# Patient Record
Sex: Female | Born: 1945 | Race: White | Hispanic: No | Marital: Single | Smoking: Former smoker
Health system: Southern US, Community
[De-identification: ages and names within clinical notes are randomized; demographics above are authoritative.]

## PROBLEM LIST (undated history)

## (undated) DIAGNOSIS — H348192 Central retinal vein occlusion, unspecified eye, stable: Secondary | ICD-10-CM

## (undated) DIAGNOSIS — R2689 Other abnormalities of gait and mobility: Secondary | ICD-10-CM

## (undated) DIAGNOSIS — K589 Irritable bowel syndrome without diarrhea: Secondary | ICD-10-CM

## (undated) DIAGNOSIS — E079 Disorder of thyroid, unspecified: Secondary | ICD-10-CM

## (undated) DIAGNOSIS — M810 Age-related osteoporosis without current pathological fracture: Secondary | ICD-10-CM

## (undated) DIAGNOSIS — Z860100 Personal history of colon polyps, unspecified: Secondary | ICD-10-CM

## (undated) DIAGNOSIS — M797 Fibromyalgia: Secondary | ICD-10-CM

## (undated) DIAGNOSIS — F39 Unspecified mood [affective] disorder: Secondary | ICD-10-CM

## (undated) DIAGNOSIS — H269 Unspecified cataract: Secondary | ICD-10-CM

## (undated) DIAGNOSIS — E785 Hyperlipidemia, unspecified: Secondary | ICD-10-CM

## (undated) DIAGNOSIS — Z8673 Personal history of transient ischemic attack (TIA), and cerebral infarction without residual deficits: Secondary | ICD-10-CM

## (undated) DIAGNOSIS — F329 Major depressive disorder, single episode, unspecified: Secondary | ICD-10-CM

## (undated) DIAGNOSIS — F32A Depression, unspecified: Secondary | ICD-10-CM

## (undated) DIAGNOSIS — I639 Cerebral infarction, unspecified: Secondary | ICD-10-CM

## (undated) DIAGNOSIS — K219 Gastro-esophageal reflux disease without esophagitis: Secondary | ICD-10-CM

## (undated) DIAGNOSIS — M1990 Unspecified osteoarthritis, unspecified site: Secondary | ICD-10-CM

## (undated) HISTORY — DX: Disorder of thyroid, unspecified: E07.9

## (undated) HISTORY — DX: Other abnormalities of gait and mobility: R26.89

## (undated) HISTORY — DX: Gastro-esophageal reflux disease without esophagitis: K21.9

## (undated) HISTORY — DX: Unspecified cataract: H26.9

## (undated) HISTORY — DX: Major depressive disorder, single episode, unspecified: F32.9

## (undated) HISTORY — DX: Irritable bowel syndrome, unspecified: K58.9

## (undated) HISTORY — DX: Age-related osteoporosis without current pathological fracture: M81.0

## (undated) HISTORY — DX: Hyperlipidemia, unspecified: E78.5

## (undated) HISTORY — DX: Personal history of transient ischemic attack (TIA), and cerebral infarction without residual deficits: Z86.73

## (undated) HISTORY — PX: THUMB ARTHROSCOPY: SHX2509

## (undated) HISTORY — DX: Central retinal vein occlusion, unspecified eye, stable: H34.8192

## (undated) HISTORY — DX: Cerebral infarction, unspecified: I63.9

## (undated) HISTORY — DX: Unspecified osteoarthritis, unspecified site: M19.90

## (undated) HISTORY — PX: COSMETIC SURGERY: SHX468

## (undated) HISTORY — DX: Unspecified mood (affective) disorder: F39

## (undated) HISTORY — DX: Depression, unspecified: F32.A

## (undated) HISTORY — DX: Personal history of colonic polyps: Z86.010

## (undated) HISTORY — DX: Fibromyalgia: M79.7

## (undated) HISTORY — DX: Personal history of colon polyps, unspecified: Z86.0100

---

## 1993-10-06 HISTORY — PX: CHOLECYSTECTOMY: SHX55

## 2005-10-06 HISTORY — PX: THYROIDECTOMY: SHX17

## 2009-06-08 ENCOUNTER — Encounter: Payer: Self-pay | Admitting: Family Medicine

## 2009-10-26 ENCOUNTER — Telehealth (INDEPENDENT_AMBULATORY_CARE_PROVIDER_SITE_OTHER): Payer: Self-pay | Admitting: *Deleted

## 2009-10-28 ENCOUNTER — Emergency Department (HOSPITAL_COMMUNITY): Admission: EM | Admit: 2009-10-28 | Discharge: 2009-10-28 | Payer: Self-pay | Admitting: Emergency Medicine

## 2009-11-17 ENCOUNTER — Encounter: Payer: Self-pay | Admitting: Family Medicine

## 2009-11-28 ENCOUNTER — Ambulatory Visit: Payer: Self-pay | Admitting: Family Medicine

## 2009-11-28 DIAGNOSIS — J40 Bronchitis, not specified as acute or chronic: Secondary | ICD-10-CM

## 2009-11-28 DIAGNOSIS — F39 Unspecified mood [affective] disorder: Secondary | ICD-10-CM

## 2009-11-28 DIAGNOSIS — Z860100 Personal history of colon polyps, unspecified: Secondary | ICD-10-CM | POA: Insufficient documentation

## 2009-11-28 DIAGNOSIS — L52 Erythema nodosum: Secondary | ICD-10-CM

## 2009-11-28 DIAGNOSIS — K219 Gastro-esophageal reflux disease without esophagitis: Secondary | ICD-10-CM | POA: Insufficient documentation

## 2009-11-28 DIAGNOSIS — E785 Hyperlipidemia, unspecified: Secondary | ICD-10-CM

## 2009-12-03 ENCOUNTER — Encounter: Admission: RE | Admit: 2009-12-03 | Discharge: 2009-12-03 | Payer: Self-pay | Admitting: Family Medicine

## 2009-12-10 ENCOUNTER — Encounter: Admission: RE | Admit: 2009-12-10 | Discharge: 2009-12-10 | Payer: Self-pay | Admitting: Family Medicine

## 2009-12-12 ENCOUNTER — Ambulatory Visit: Payer: Self-pay | Admitting: Family Medicine

## 2009-12-12 ENCOUNTER — Other Ambulatory Visit: Admission: RE | Admit: 2009-12-12 | Discharge: 2009-12-12 | Payer: Self-pay | Admitting: Family Medicine

## 2009-12-12 DIAGNOSIS — M79609 Pain in unspecified limb: Secondary | ICD-10-CM | POA: Insufficient documentation

## 2009-12-12 DIAGNOSIS — R928 Other abnormal and inconclusive findings on diagnostic imaging of breast: Secondary | ICD-10-CM | POA: Insufficient documentation

## 2009-12-12 LAB — CONVERTED CEMR LAB: Sed Rate: 14 mm/hr (ref 0–22)

## 2009-12-14 ENCOUNTER — Encounter: Admission: RE | Admit: 2009-12-14 | Discharge: 2009-12-14 | Payer: Self-pay | Admitting: Family Medicine

## 2009-12-18 ENCOUNTER — Encounter (INDEPENDENT_AMBULATORY_CARE_PROVIDER_SITE_OTHER): Payer: Self-pay | Admitting: *Deleted

## 2009-12-24 ENCOUNTER — Telehealth: Payer: Self-pay | Admitting: Family Medicine

## 2009-12-28 ENCOUNTER — Ambulatory Visit: Payer: Self-pay | Admitting: Family Medicine

## 2010-01-03 ENCOUNTER — Telehealth: Payer: Self-pay | Admitting: Family Medicine

## 2010-01-04 ENCOUNTER — Telehealth: Payer: Self-pay | Admitting: Family Medicine

## 2010-01-04 ENCOUNTER — Encounter: Payer: Self-pay | Admitting: Family Medicine

## 2010-01-11 ENCOUNTER — Encounter: Payer: Self-pay | Admitting: Family Medicine

## 2010-01-21 ENCOUNTER — Ambulatory Visit: Payer: Self-pay | Admitting: Family Medicine

## 2010-01-21 DIAGNOSIS — M949 Disorder of cartilage, unspecified: Secondary | ICD-10-CM

## 2010-01-21 DIAGNOSIS — M899 Disorder of bone, unspecified: Secondary | ICD-10-CM | POA: Insufficient documentation

## 2010-01-25 ENCOUNTER — Encounter: Admission: RE | Admit: 2010-01-25 | Discharge: 2010-01-25 | Payer: Self-pay | Admitting: Family Medicine

## 2010-01-25 ENCOUNTER — Encounter: Payer: Self-pay | Admitting: Family Medicine

## 2010-01-30 ENCOUNTER — Ambulatory Visit: Payer: Self-pay | Admitting: Family Medicine

## 2010-01-30 ENCOUNTER — Telehealth: Payer: Self-pay | Admitting: Family Medicine

## 2010-01-31 ENCOUNTER — Encounter: Payer: Self-pay | Admitting: Family Medicine

## 2010-02-19 ENCOUNTER — Telehealth: Payer: Self-pay | Admitting: Family Medicine

## 2010-03-10 ENCOUNTER — Telehealth: Payer: Self-pay | Admitting: Family Medicine

## 2010-04-04 ENCOUNTER — Telehealth: Payer: Self-pay | Admitting: Family Medicine

## 2010-05-07 ENCOUNTER — Telehealth: Payer: Self-pay | Admitting: Family Medicine

## 2010-05-15 ENCOUNTER — Inpatient Hospital Stay (HOSPITAL_COMMUNITY): Admission: EM | Admit: 2010-05-15 | Discharge: 2010-05-16 | Payer: Self-pay | Admitting: Emergency Medicine

## 2010-05-16 ENCOUNTER — Encounter (INDEPENDENT_AMBULATORY_CARE_PROVIDER_SITE_OTHER): Payer: Self-pay | Admitting: Internal Medicine

## 2010-05-22 ENCOUNTER — Ambulatory Visit: Payer: Self-pay | Admitting: Family Medicine

## 2010-05-22 DIAGNOSIS — G459 Transient cerebral ischemic attack, unspecified: Secondary | ICD-10-CM | POA: Insufficient documentation

## 2010-05-22 DIAGNOSIS — J984 Other disorders of lung: Secondary | ICD-10-CM | POA: Insufficient documentation

## 2010-06-04 ENCOUNTER — Ambulatory Visit: Payer: Self-pay | Admitting: Family Medicine

## 2010-06-22 ENCOUNTER — Ambulatory Visit: Payer: Self-pay | Admitting: Family Medicine

## 2010-06-22 DIAGNOSIS — M461 Sacroiliitis, not elsewhere classified: Secondary | ICD-10-CM

## 2010-06-24 ENCOUNTER — Telehealth: Payer: Self-pay | Admitting: Family Medicine

## 2010-07-05 ENCOUNTER — Emergency Department (HOSPITAL_COMMUNITY): Admission: EM | Admit: 2010-07-05 | Discharge: 2010-07-05 | Payer: Self-pay | Admitting: Emergency Medicine

## 2010-07-05 ENCOUNTER — Encounter: Payer: Self-pay | Admitting: Family Medicine

## 2010-07-08 ENCOUNTER — Telehealth: Payer: Self-pay | Admitting: Family Medicine

## 2010-08-09 ENCOUNTER — Encounter (INDEPENDENT_AMBULATORY_CARE_PROVIDER_SITE_OTHER): Payer: Self-pay | Admitting: *Deleted

## 2010-08-13 ENCOUNTER — Ambulatory Visit: Payer: Self-pay | Admitting: Family Medicine

## 2010-08-13 LAB — CONVERTED CEMR LAB: BUN: 22 mg/dL (ref 6–23)

## 2010-08-15 ENCOUNTER — Ambulatory Visit: Payer: Self-pay | Admitting: Cardiology

## 2010-08-16 ENCOUNTER — Ambulatory Visit: Payer: Self-pay | Admitting: Family Medicine

## 2010-08-16 DIAGNOSIS — D239 Other benign neoplasm of skin, unspecified: Secondary | ICD-10-CM | POA: Insufficient documentation

## 2010-08-18 ENCOUNTER — Encounter: Admission: RE | Admit: 2010-08-18 | Discharge: 2010-08-18 | Payer: Self-pay | Admitting: Neurology

## 2010-09-09 ENCOUNTER — Telehealth: Payer: Self-pay | Admitting: Family Medicine

## 2010-09-12 ENCOUNTER — Ambulatory Visit: Payer: Self-pay | Admitting: Family Medicine

## 2010-09-12 ENCOUNTER — Encounter: Payer: Self-pay | Admitting: Family Medicine

## 2010-09-16 ENCOUNTER — Telehealth: Payer: Self-pay | Admitting: Family Medicine

## 2010-09-17 ENCOUNTER — Telehealth: Payer: Self-pay | Admitting: Family Medicine

## 2010-09-19 ENCOUNTER — Encounter: Payer: Self-pay | Admitting: Family Medicine

## 2010-09-26 ENCOUNTER — Telehealth: Payer: Self-pay | Admitting: Family Medicine

## 2010-09-26 LAB — CONVERTED CEMR LAB
Rubella: 297.6 intl units/mL — ABNORMAL HIGH
Rubeola IgG: 4.13 — ABNORMAL HIGH

## 2010-10-14 ENCOUNTER — Telehealth: Payer: Self-pay | Admitting: Family Medicine

## 2010-10-15 ENCOUNTER — Ambulatory Visit
Admission: RE | Admit: 2010-10-15 | Discharge: 2010-10-15 | Payer: Self-pay | Source: Home / Self Care | Attending: Family Medicine | Admitting: Family Medicine

## 2010-10-25 ENCOUNTER — Other Ambulatory Visit: Payer: Self-pay | Admitting: Family Medicine

## 2010-10-25 DIAGNOSIS — R918 Other nonspecific abnormal finding of lung field: Secondary | ICD-10-CM

## 2010-10-27 ENCOUNTER — Encounter: Payer: Self-pay | Admitting: Family Medicine

## 2010-11-05 NOTE — Letter (Signed)
Summary: [place] No Show Letter  [place] [facility]  [address] [place], [place] [address]   Phone: [phone]  Fax: [phone]    08/09/2010 MRN: [id number]  AMAZIN PINCOCK [address] [place], [place]  [address]   Dear Ms. Lequire,   Our records indicate that you missed your scheduled appointment with _______lab______________ on ____11/3/11________.  Please contact this office to reschedule your appointment as soon as possible.  It is important that you keep your scheduled appointments with your physician, so we can provide you the best care possible.  Please be advised that there may be a charge for "no show" appointments.    Sincerely,   Paul at [facility]

## 2010-11-05 NOTE — Assessment & Plan Note (Signed)
Summary: leg pain in left leg/rbh   Vital Signs:  Patient profile:   65 year old female Height:      64.25 inches Weight:      188.13 pounds BMI:     32.16 Temp:     98.9 degrees F oral Pulse rate:   88 / minute Pulse rhythm:   regular BP sitting:   126 / 72  (left arm) Cuff size:   large  Vitals Entered By: Delilah Shan CMA Duncan Dull) (December 28, 2009 3:54 PM) CC: Left leg pain   History of Present Illness: 65 yo here for follow up left thigh pain. Still bothering here, describes it as an itching, burning, light touch bothers her more than squeezing or pressure.  Of note, also developped three pustules surrounded by erythema on lateral edge of left thigh, they were very painful and itchy. 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  No swelling or redness of knee or thigh. Pain not worsened with walking.  Has fibromyalgia but has never been on any meds for it.  ANA, CK and SED rate were all normal.  Current Medications (verified): 1)  Omeprazole 40 Mg Cpdr (Omeprazole) .... Take 1 Tablet By Mouth Two Times A Day 2)  Trazodone Hcl 100 Mg Tabs (Trazodone Hcl) .... 200 Mg.  Once Daily 3)  Lamotrigine 200 Mg Tabs (Lamotrigine) .... 225 Mg. Once Daily 4)  Vitamin D 1000 Unit  Tabs (Cholecalciferol) .... Take 1 Tablet By Mouth Two Times A Day 5)  Lovaza 1 Gm Caps (Omega-3-Acid Ethyl Esters) .... 2 Grams . Two Times A Day 6)  Calcium Carbonate-Vitamin D 600-400 Mg-Unit  Tabs (Calcium Carbonate-Vitamin D) .... Take 1 Tablet By Mouth Two Times A Day 7)  Simvastatin 40 Mg Tabs (Simvastatin) .... Take 1 Tablet By Mouth Once A Day 8)  Niaspan 500 Mg Cr-Tabs (Niacin (Antihyperlipidemic)) .Marland Kitchen.. 100 Mg. Once Daily 9)  Mobic 15 Mg Tabs (Meloxicam) .Marland Kitchen.. 1 Tab By Mouth Daily As Needed Pain. 10)  Lexapro 10 Mg Tabs (Escitalopram Oxalate) .... Take 1 Tablet By Mouth Once A Day 11)  Gabapentin 300 Mg  Caps (Gabapentin) .Marland Kitchen.. 1 Tab By Mouth  Qhs  Allergies (verified): No Known Drug Allergies  Review of Systems      See HPI MS:  Denies joint pain, joint redness, joint swelling, loss of strength, muscle aches, muscle weakness, and stiffness.  Physical Exam  General:  alert, pleasant, NAD. Extremities:  Left thigh:  no edema, 3 healing vessicles, tender to palp, not along dermatome. Psych:  alert, very pleasant, good eye contact.   Impression & Recommendations:  Problem # 1:  PAIN IN SOFT TISSUES OF LIMB (ICD-729.5) Assessment Deteriorated Appears to be neuropathic in nature. Time spent with patient 25 minutes, more than 50% of this time was spent discussing causes of neuropathic pain. Could be zoster however it is not classicly following a dermatome. May be due to her fibromyalgia.  I would like to start a neuropathic pain meds.  We discussed options including Gabapentin, lyrica, and Sevela.  Agreed to start Gabapentin with close follow up.  Complete Medication List: 1)  Omeprazole 40 Mg Cpdr (Omeprazole) .... Take 1 tablet by mouth two times a day 2)  Trazodone Hcl 100 Mg Tabs (Trazodone hcl) .... 200 mg.  once daily 3)  Lamotrigine 200 Mg Tabs (Lamotrigine) .... 225 mg. once daily 4)  Vitamin D 1000 Unit Tabs (Cholecalciferol) .... Take  1 tablet by mouth two times a day 5)  Lovaza 1 Gm Caps (Omega-3-acid ethyl esters) .... 2 grams . two times a day 6)  Calcium Carbonate-vitamin D 600-400 Mg-unit Tabs (Calcium carbonate-vitamin d) .... Take 1 tablet by mouth two times a day 7)  Simvastatin 40 Mg Tabs (Simvastatin) .... Take 1 tablet by mouth once a day 8)  Niaspan 500 Mg Cr-tabs (Niacin (antihyperlipidemic)) .Marland Kitchen.. 100 mg. once daily 9)  Mobic 15 Mg Tabs (Meloxicam) .Marland Kitchen.. 1 tab by mouth daily as needed pain. 10)  Lexapro 10 Mg Tabs (Escitalopram oxalate) .... Take 1 tablet by mouth once a day 11)  Gabapentin 300 Mg Caps (Gabapentin) .Marland Kitchen.. 1 tab by mouth qhs  Patient Instructions: 1)  Good to see you, Ms. Stemm. 2)   Please take Gabapentin 300 mg nightly.  You may increase up to 300 mg twice or three times daily before I see you next. Prescriptions: GABAPENTIN 300 MG  CAPS (GABAPENTIN) 1 tab by mouth qhs  #90 x 0   Entered and Authorized by:   Ruthe Mannan MD   Signed by:   Ruthe Mannan MD on 12/28/2009   Method used:   [facility]        [facility] (retail)       [address]       [place], [place]  [address]       Ph: [phone] or [phone]       Fax: [phone]   RxID:   [id number]    Orders Added: 1)  Est. Patient Level IV [27253]   Current Allergies (reviewed today): No known allergies

## 2010-11-05 NOTE — Letter (Signed)
Summary: Out of Work  [employer] at [facility]  [address] [place], [place] [address]   Phone: [phone]  Fax: [phone]    January 31, 2010   Employee:  Nicole Perkins    To Whom It May Concern:   Nicole Perkins has osteopenia.  Has been on Vit D/calcium and participates in weight bearing activity. She has signifcant acid reflux, requiring high dose PPI and therefore needs MONTHLY not weekly dosing of bisphosphonate.  Please allow pt to receive Boniva.  If you need additional information, please feel free to contact our office.         Sincerely,    Ruthe Mannan MD  Appended Document: Out of Work Letter faxed to [employer] along with denial letter.  Fax number [phone].  Appended Document: Out of Work [employer] to check on the status of Boniva.  The letter for appeal was faxed on 02/01/2010.  Spoke to [place] and she says that the decision has not yet been made and that we should hear from [facility] by fax within 24-48 hours.  I told her that I will call back if we still haven't heard anything by then.    Appended Document: Out of Work [employer] to check status of PA of Archie.  Was advised by rep that the appeals process can take up to 30 business days.  She advised me to call back if I haven't heard anything by 03/03/2010.

## 2010-11-05 NOTE — Assessment & Plan Note (Signed)
Summary: HOSPITAL F/U FOR TIA - SMALL STROKE / LFW   Vital Signs:  Patient profile:   65 year old female Height:      64.25 inches Weight:      188 pounds BMI:     32.14 Temp:     98.4 degrees F oral Pulse rate:   64 / minute Pulse rhythm:   regular BP sitting:   102 / 64  (right arm) Cuff size:   large  Vitals Entered By: Nicole Perkins CMA Nicole Perkins) (May 22, 2010 12:03 PM) CC: hospital follow up   History of Present Illness: 65 yo here for hospital follow up.  No discharge summary dictated but per pt's report, pt hospitalized from 05/15/10 to 05/16/10 for TIA symptoms.  Nicole Perkins was in bed, got up in the middle of the night on the 10th of August to go the restroom and felt "like a drunken sailor on a rocking ship."  She felt very uneasy on her feet, dizzy.  She was unsure if one side felt worse than the other.  Denied headache, slurred speech, facial droop or other focal neurological defitis.  Given ASA in ambulance, she felt symptoms resolved within hours. Currently denying any residual deficits but says that for sometime (years) she has difficulty "getting her words out." She has also noticed that her ears ring more frequently since this event. Was placed on Aggrenox and sent home.  No neurology follow up made. Since discharge, she has had no recurrent epsidose but has a strong FH of CVA and she is very concerned about having a stroke.    H and P, labs, studies reviewed. Lipid panel stable, HDL still low at 32. 2 echo, neg for embolic source.  Head CT neg. MRI of head no acute ischemia, nonspecific cortical white matter changes.   Chest CT showed 7 x 11 mm ground glass opacity within superior segment of left lower lobe, per pt, treated with avelox.  Suggested 3 month follow up CT in report. MRA of head focal signal drop off in right MCA-?vessel tortuosity vs athersclerotic stenosis. Carotid doppler neg.  Current Medications (verified): 1)  Omeprazole 40 Mg Cpdr (Omeprazole)  .... Take 1 Tablet By Mouth Two Times A Day 2)  Trazodone Hcl 100 Mg Tabs (Trazodone Hcl) .... 200 Mg.  Once Daily 3)  Lamotrigine 200 Mg Tabs (Lamotrigine) .... 225 Mg. Once Daily 4)  Vitamin D 1000 Unit  Tabs (Cholecalciferol) .... Take 1 Tablet By Mouth in Am and Two Tabs By Mouth in Pm 5)  Lovaza 1 Gm Caps (Omega-3-Acid Ethyl Esters) .... 2 Grams . Two Times A Day 6)  Calcium Carbonate-Vitamin D 600-400 Mg-Unit  Tabs (Calcium Carbonate-Vitamin D) .... Take 1 Tablet By Mouth Two Times A Day 7)  Simvastatin 40 Mg Tabs (Simvastatin) .... Take 1 Tablet By Mouth Once A Day 8)  Niaspan 500 Mg Cr-Tabs (Niacin (Antihyperlipidemic)) .Marland Kitchen.. 1000 Mg. Once Daily 9)  Gabapentin 300 Mg  Caps (Gabapentin) .Marland Kitchen.. 1 Tab By Mouth Qhs 10)  Lexapro 20 Mg Tabs (Escitalopram Oxalate) .Marland Kitchen.. 1 By Mouth Daily 11)  Alendronate Sodium 70 Mg  Tabs (Alendronate Sodium) .Marland Kitchen.. 1 By Mouth Q Week . Take With 8 Ounces of Water On and Empty Stomach 12)  Aggrenox 25-200 Mg Xr12h-Cap (Aspirin-Dipyridamole) .... Take One Tablet By Mouth Daily  Allergies (verified): No Known Drug Allergies  Past History:  Past Medical History: Last updated: 11/28/2009 Mood Disorder, NOS Fibromyalgia Colonic polyps, hx of GERD Hyperlipidemia IBS  Past Surgical History: Last updated: 11/28/2009 Thyroidectomy, partial in 2007 Cosmetic surgery  Cholecystectomy 1995  Family History: Last updated: 11/28/2009 Family History of Alcoholism/Addiction Family History of Anxiety Family History of Asthma Family History Depression  Social History: Last updated: 11/28/2009 Moved here from [place] several months ago to be closer to her LOVELY family - daughter Nicole Perkins. Retired Visual merchandiser.  Alcohol use-yes Non smoker.  Review of Systems      See HPI General:  Denies malaise. Eyes:  Denies blurring. ENT:  Denies difficulty swallowing. CV:  Denies chest pain or discomfort. Resp:  Denies shortness of breath. GI:   Denies abdominal pain and change in bowel habits. MS:  Denies joint pain, joint redness, joint swelling, and muscle weakness. Derm:  Denies rash. Neuro:  Denies headaches, memory loss, numbness, poor balance, seizures, sensation of room spinning, tingling, tremors, visual disturbances, and weakness. Psych:  Complains of anxiety; denies sense of great danger, suicidal thoughts/plans, thoughts of violence, unusual visions or sounds, and thoughts /plans of harming others. Endo:  Denies excessive thirst and excessive urination. Heme:  Denies abnormal bruising and bleeding.  Physical Exam  General:  alert, pleasant, NAD. Eyes:  vision grossly intact, pupils equal, and pupils round.   Ears:  no external deformities.   Nose:  no external deformity.   Mouth:  MMM Extremities:  no edema Neurologic:  alert & oriented X3, cranial nerves II-XII intact, strength normal in all extremities, sensation intact to light touch, sensation intact to pinprick, gait normal, and DTRs symmetrical and normal.   Skin:  Intact without suspicious lesions or rashes Psych:  alert, very pleasant, good eye contact.   Impression & Recommendations:  Problem # 1:  TIA (ICD-435.9) Assessment New Time spent with patient 45 minutes, more than 50% of this time was spent counseling patient on TIA and reviewing hospital records with Nicole Perkins.  I agree that we should continue Aggrenox, will refer to neurology for further work up given that she is truly unsure about residual deficits- ?aphasia, ringing in ears.  Her updated medication list for this problem includes:    Aggrenox 25-200 Mg Xr12h-cap (Aspirin-dipyridamole) .Marland Kitchen... Take one tablet by mouth daily  Orders: Neurology Referral (Neuro)  Problem # 2:  OTHER DISEASES OF LUNG NOT ELSEWHERE CLASSIFIED (ICD-518.89) Order placed to repeat CT in 3 months. Orders: Radiology Referral (Radiology)  Complete Medication List: 1)  Omeprazole 40 Mg Cpdr (Omeprazole) .... Take 1  tablet by mouth two times a day 2)  Trazodone Hcl 100 Mg Tabs (Trazodone hcl) .... 200 mg.  once daily 3)  Lamotrigine 200 Mg Tabs (Lamotrigine) .... 225 mg. once daily 4)  Vitamin D 1000 Unit Tabs (Cholecalciferol) .... Take 1 tablet by mouth in am and two tabs by mouth in pm 5)  Lovaza 1 Gm Caps (Omega-3-acid ethyl esters) .... 2 grams . two times a day 6)  Calcium Carbonate-vitamin D 600-400 Mg-unit Tabs (Calcium carbonate-vitamin d) .... Take 1 tablet by mouth two times a day 7)  Simvastatin 40 Mg Tabs (Simvastatin) .... Take 1 tablet by mouth once a day 8)  Niaspan 500 Mg Cr-tabs (Niacin (antihyperlipidemic)) .Marland Kitchen.. 1000 mg. once daily 9)  Gabapentin 300 Mg Caps (Gabapentin) .Marland Kitchen.. 1 tab by mouth qhs 10)  Lexapro 20 Mg Tabs (Escitalopram oxalate) .Marland Kitchen.. 1 by mouth daily 11)  Alendronate Sodium 70 Mg Tabs (Alendronate sodium) .Marland Kitchen.. 1 by mouth q week . take with 8 ounces of water on and empty stomach 12)  Aggrenox 25-200 Mg Xr12h-cap (Aspirin-dipyridamole) .... Take one tablet by mouth daily  Patient Instructions: 1)  Please stop by to see Shirlee Limerick on your way out.  Current Allergies (reviewed today): No known allergies

## 2010-11-05 NOTE — Progress Notes (Signed)
Summary: New patient, sick  Phone Note Call from Patient   Caller: Patient Call For: Dr. Dayton Martes Summary of Call: Patient is scheduled to see Dr. Dayton Martes in April but wanted to know if she could be seen sooner.  She has a bad cold and has been dealing with it for about six weeks now.  If she can't get in to see Dr. Dayton Martes before her April new patient visit she would like Dr. Dayton Martes to recommend somewhere she can go to be seen.  She is new to the area and doesn't know many places around this area. Initial call taken by: Linde Gillis CMA Duncan Dull),  October 26, 2009 9:27 AM  Follow-up for Phone Call        This would be a question for front office.  Will send to [facility]. Follow-up by: Ruthe Mannan MD,  October 26, 2009 9:54 AM  Additional Follow-up for Phone Call Additional follow up Details #1::        Sp w/ pt--went to the Urgent Care on [facility].Daine Gip  October 26, 2009 2:18 PM  Additional Follow-up by: Daine Gip,  October 26, 2009 2:18 PM

## 2010-11-05 NOTE — Letter (Signed)
Summary: Physician Approval for Weight Loss program  Physician Approval for Weight Loss program   Imported By: Maryln Gottron 07/11/2010 15:41:27  _____________________________________________________________________  External Attachment:    Type:   Image     Comment:   External Document

## 2010-11-05 NOTE — Progress Notes (Signed)
Summary: refill request for trazadone  Phone Note Refill Request Message from:  Fax from Pharmacy  Refills Requested: Medication #1:  TRAZODONE HCL 100 MG TABS 200 mg.  once daily [facility]ed [facility] from [facility] is on your desk.  Initial call taken by: Lowella Petties CMA,  January 04, 2010 10:44 AM  Follow-up for Phone Call        on my desk Follow-up by: Ruthe Mannan MD,  January 04, 2010 11:00 AM  Additional Follow-up for Phone Call Additional follow up Details #1::        Faxed Additional Follow-up by: Delilah Shan CMA (AAMA),  January 04, 2010 2:35 PM

## 2010-11-05 NOTE — Progress Notes (Signed)
Summary: Rx Fosamax  Phone Note Call from Patient   Caller: Patient Call For: Nicole Mannan MD Summary of Call: Patient would like a [facility]  She agrees to start taking Fosamax.  Please advise. Initial call taken by: Linde Gillis CMA Duncan Dull),  Feb 19, 2010 8:58 AM    Prescriptions: ALENDRONATE SODIUM 70 MG  TABS (ALENDRONATE SODIUM) 1 by mouth q week . Take with 8 ounces of water on and empty stomach  #6 x 3   Entered and Authorized by:   Nicole Mannan MD   Signed by:   Nicole Mannan MD on 02/19/2010   Method used:   [facility]        [facility] (retail)       [address]       [place], [place]  [address]       Ph: [phone] or [phone]       Fax: [phone]   RxID:   [id number]

## 2010-11-05 NOTE — Assessment & Plan Note (Signed)
Summary: NEW PT TO EST/CLE   Vital Signs:  Patient profile:   65 year old female Height:      64.25 inches Weight:      186.38 pounds BMI:     31.86 Temp:     98.4 degrees F oral Pulse rate:   88 / minute Pulse rhythm:   regular BP sitting:   101 / 58  (left arm) Cuff size:   large  Vitals Entered By: Delilah Shan CMA Duncan Dull) (November 28, 2009 10:55 AM) CC: NP to Establish   History of Present Illness: 65 yo pleasant female here to establish care. Moved here from [place] to be closer to her daughter and son in law, Lanora Manis and Clydie Braun.  1.  Recent bronchitis/lung shadow on CXR- went to urgent care on 10/28/09 for cough, congestion.  Given Zpack, congestion and cough persisted, given Avelox.  Feels much better.  CXR showed possible RLL lung shadow.  No current cough, shortness of breath, CP, or sputum production.  She is a non smoker.  2.  LE painful rash- after URI, noticed LE painful rash,  Started with Dolby painful area behind lower left leg.  No visible rash but hurt to touch, even sheets on bed hurt.   A couple days later, noticed a red, raised lump in the that area, then developped several more scattered on left leg, a couple on right.  Not better but about the same.  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  He already drew labs which she said were normal.  TB skin test was normal. Awaiting records from [place].  3.  Mood disorder, NOS- has been on Lamotrigene 225 mg daily and Lexapro 20 mg daily for years.  Has been keeping up with monitoring blood work.  Has always had issues with depression and reports episodes of mania and suicidal thoughts when off medications.  Never diagnosed with bipolar disorder.  Has expienced multiple traumas, received extensive therapy in past.  Not currently seeing anyone and does not feel she needs to at at this time.  Symptoms are well controlled.  No recent symtpoms of depression, rapid cycling or  mania.  4.  Well woman- Needs pap, mammogram, Zostavax.  UTD colonoscopy, pneumovax, FLP, VIt D.  Current Medications (verified): 1)  Omeprazole 40 Mg Cpdr (Omeprazole) .... Take 1 Tablet By Mouth Two Times A Day 2)  Lexapro 20 Mg Tabs (Escitalopram Oxalate) .... Take 1 Tablet By Mouth Once A Day 3)  Trazodone Hcl 100 Mg Tabs (Trazodone Hcl) .... 200 Mg.  Once Daily 4)  Lamotrigine 200 Mg Tabs (Lamotrigine) .... 225 Mg. Once Daily 5)  Biotin 1000 Mcg Tabs (Biotin) .... Take 1 Tablet By Mouth Once A Day 6)  Vitamin D 1000 Unit  Tabs (Cholecalciferol) .... Take 1 Tablet By Mouth Two Times A Day 7)  Lovaza 1 Gm Caps (Omega-3-Acid Ethyl Esters) .... 2 Grams . Two Times A Day 8)  Calcium Carbonate-Vitamin D 600-400 Mg-Unit  Tabs (Calcium Carbonate-Vitamin D) .... Take 1 Tablet By Mouth Two Times A Day 9)  Simvastatin 40 Mg Tabs (Simvastatin) .... Take 1 Tablet By Mouth Once A Day 10)  Niaspan 500 Mg Cr-Tabs (Niacin (Antihyperlipidemic)) .Marland Kitchen.. 100 Mg. Once Daily  Allergies (verified): No Known Drug Allergies  Past History:  Family History: Last updated: 11/28/2009 Family History of Alcoholism/Addiction Family History of Anxiety Family History of Asthma Family History Depression  Social History: Last updated: 11/28/2009 Moved  here from [place] several months ago to be closer to her LOVELY family - daughter Currie Paris. Retired Visual merchandiser.  Alcohol use-yes Non smoker.  Past Medical History: Mood Disorder, NOS Fibromyalgia Colonic polyps, hx of GERD Hyperlipidemia IBS  Past Surgical History: Thyroidectomy, partial in 2007 Cosmetic surgery  Cholecystectomy 1995  Family History: Reviewed history and no changes required. Family History of Alcoholism/Addiction Family History of Anxiety Family History of Asthma Family History Depression  Social History: Reviewed history and no changes required. Moved here from [place] several months ago to be  closer to her LOVELY family - daughter Currie Paris. Retired Visual merchandiser.  Alcohol use-yes Non smoker.  Review of Systems      See HPI General:  Denies chills, fever, and weight loss. Eyes:  Denies blurring. ENT:  Denies difficulty swallowing. CV:  Denies chest pain or discomfort. Resp:  Denies shortness of breath. GI:  Denies abdominal pain, diarrhea, nausea, and vomiting. MS:  Denies muscle weakness. Derm:  Complains of changes in color of skin and rash. Neuro:  Denies headaches. Psych:  Denies mental problems, panic attacks, sense of great danger, suicidal thoughts/plans, thoughts of violence, unusual visions or sounds, and thoughts /plans of harming others. Heme:  Denies abnormal bruising, bleeding, enlarge lymph nodes, fevers, and pallor.  Physical Exam  General:  alert, pleasant, NAD. Eyes:  No corneal or conjunctival inflammation noted. EOMI. Perrla. Funduscopic exam benign, without hemorrhages, exudates or papilledema. Vision grossly normal. Ears:  External ear exam shows no significant lesions or deformities.  Otoscopic examination reveals clear canals, tympanic membranes are intact bilaterally without bulging, retraction, inflammation or discharge. Hearing is grossly normal bilaterally. Mouth:  MMM Lungs:  Normal respiratory effort, chest expands symmetrically. Lungs are clear to auscultation, no crackles or wheezes. Heart:  Normal rate and regular rhythm. S1 and S2 normal without gallop, murmur, click, rub or other extra sounds. Extremities:  no edema, firm, erythematous nodules on left lower leg, some painful to touch, not warm, echymosis on right upper thigh Psych:  alert, very pleasant, good eye contact.   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  Awaiting labs from her  physician in [place], hopefull receive those today. Would like a CBC, LFTs, BMET, ASO titers.  TB skin test was negative. I will see her back in March, if rash worsens or is unresolved within 6 weeks, can get a biopsy but typically this is not warranted. Mobic for pain and inflammation.  Problem # 2:  BRONCHITIS (ICD-490) Assessment: Improved did have a breast shadow on CXR. Set up a repeat CXR, 2 view, 6 weeks from prior xray. Orders: Radiology Referral (Radiology)  Problem # 3:  MOOD DISORDER (ICD-296.90) Assessment: Unchanged Stable on Lamotrigene and Lexapro. Give name and contact information for Lorenda Cahill should she decide she wants a local therapist.  Problem # 4:  Preventive Health Care (ICD-V70.0) Assessment: Comment Only Will schedule mammogram today.  [place] CPE/Pap in March.  Will hopefully have more Zostavax in by then.  Complete Medication List: 1)  Omeprazole 40 Mg Cpdr (Omeprazole) .... Take 1 tablet by mouth two times a day 2)  Lexapro 20 Mg Tabs (Escitalopram oxalate) .... Take 1 tablet by mouth once a day 3)  Trazodone Hcl 100 Mg Tabs (Trazodone hcl) .... 200 mg.  once daily  4)  Lamotrigine 200 Mg Tabs (Lamotrigine) .... 225 mg. once daily 5)  Biotin 1000 Mcg Tabs (Biotin) .... Take 1 tablet by mouth once a day 6)  Vitamin D 1000 Unit Tabs (Cholecalciferol) .... Take 1 tablet by mouth two times a day 7)  Lovaza 1 Gm Caps (Omega-3-acid ethyl esters) .... 2 grams . two times a day 8)  Calcium Carbonate-vitamin D 600-400 Mg-unit Tabs (Calcium carbonate-vitamin d) .... Take 1 tablet by mouth two times a day 9)  Simvastatin 40 Mg Tabs (Simvastatin) .... Take 1 tablet by mouth once a day 10)  Niaspan 500 Mg Cr-tabs (Niacin (antihyperlipidemic)) .Marland Kitchen.. 100 mg. once daily 11)  Mobic 15 Mg Tabs (Meloxicam) .Marland Kitchen.. 1 tab by mouth daily as needed pain.  Patient Instructions: 1)  It was so nice to meet you, Nicole Perkins. 2)  Please tell Neva Seat, and the family hello for  me. 3)  Stop by on the way out to see Shirlee Limerick who will set up your Mammogram and Chest Xray. 4)  Make an appointment to see me during the week of Mach 7th, after your chest xray.  We can do your pap smear at that time and give your Shingles vaccine. 5)  Dr. Madelaine Etienne 6)  [address] [facility] 7)  [place], [place], [address] 8)  Phone Number: [phone]  Prescriptions: MOBIC 15 MG TABS (MELOXICAM) 1 tab by mouth daily as needed pain.  #30 x 0   Entered and Authorized by:   Ruthe Mannan MD   Signed by:   Ruthe Mannan MD on 11/28/2009   Method used:   [facility]        [facility] [id number]       [address]       [place], [place]  [address]       Ph: [phone] or [phone]       Fax: [phone]   RxID:   [id number]   Current Allergies (reviewed today): No known allergies   Pneumovax Result Date:  11/22/2008 Pneumovax Result:  historical Flex Sig Next Due:  Not Indicated Colonoscopy Result Date:  03/11/2008 Colonoscopy Result:  historical Colonoscopy Next Due:  5 yr Hemoccult Next Due:  Not Indicated

## 2010-11-05 NOTE — Medication Information (Signed)
Summary: Renewal Request for Trazodone  Renewal Request for Trazodone   Imported By: Maryln Gottron 01/09/2010 14:07:02  _____________________________________________________________________  External Attachment:    Type:   Image     Comment:   External Document

## 2010-11-05 NOTE — Progress Notes (Signed)
Summary: prior Berkley Harvey will be needed for lexapro  Phone Note From Pharmacy   Caller: [facility] Summary of Call: Lexapro is not covered by insurance, will need prior auth.  Form is on your desk listing preferred meds.  I will call for prior auth if you want. Initial call taken by: Lowella Petties CMA,  January 03, 2010 4:03 PM  Follow-up for Phone Call        In my box. Follow-up by: Ruthe Mannan MD,  January 03, 2010 4:19 PM  Additional Follow-up for Phone Call Additional follow up Details #1::        Faxed and given to Lora Paula CMA Duncan Dull)  January 03, 2010 4:41 PM      Appended Document: prior Berkley Harvey will be needed for lexapro Patient called to check the status of the PA for Lexapro.  Advised patient that the PA form was faxed to [facility] on 01/03/2010.  Faxed PA form again today.  Will let patient know details on PA as soon as we hear something from [facility].  Appended Document: prior Berkley Harvey will be needed for lexapro Received PA Approval for Lexapro.  Effective 01/11/2010 Expiration 01/11/2011.  Patient and pharmacy notified.

## 2010-11-05 NOTE — Progress Notes (Signed)
Summary: call a nurse   Phone Note Call from Patient   Call For: Ruthe Mannan MD Summary of Call: Triage Record Num: [id number] Operator: Dayton Martes Patient Name: Nicole Perkins Call Date & Time: 06/22/2010 10:18:36AM Patient Phone: [phone] PCP: Patient Gender: Female PCP Fax : Patient DOB: June 11, 1946 Practice Name: [facility] Reason for Call: Pt c/o pain "in the back of my L hip or my buttock toward the top." Onset  ~ 48 hrs ago. Denies urgent emergent sx's. Care adv given. Per Erie Noe in office have pt come to office now. RN adv pt. Protocol(s) Used: Back Symptoms Recommended Outcome per Protocol: See Provider within 72 Hours Reason for Outcome: Mild to moderate pain in back with normal activity or rest AND not responding to 72 hours of home care Care Advice:  ~ Call provider if symptoms worsen or new symptoms develop.  ~ Avoid heavy lifting, bending and twisting of the back, and prolonged sitting until evaluated by provider. Apply a cloth-covered cold or ice pack to the area for 20 minutes 4 to 8 times a day for relief of pain for the first 24-48 hours. After 24 to 48 hours of cold application, use a cloth-covered heat pack to the area for 20 minutes 3 to 4 times a day.  ~ Sleep on a moderately firm mattress. Try sleeping on back with a pillow placed under knees or sleep on side with knees bent and a pillow between knees. When lying on stomach, place pillow under the abdomen and pelvis; do not use a pillow for your head.  ~ Go to the ED if you have worsening pain, numbness or weakness of arms or legs, cannot walk, or have new unexplained changes in bladder or bowel function. Another adult should drive.  ~  ~ Avoid activity that causes or worsens symptoms.  ~ SYMPTOM / CONDITION MANAGEMENT 06/22/2010 1:38:31PM Page 1 of 1 [id number] Initial call taken by: Melody Comas,  June 24, 2010 8:26 AM

## 2010-11-05 NOTE — Assessment & Plan Note (Signed)
Summary: discuss immunizations/alc   Vital Signs:  Patient profile:   65 year old female Height:      64.25 inches Weight:      161 pounds Temp:     98.5 degrees F oral Pulse rate:   64 / minute Pulse rhythm:   regular BP sitting:   110 / 60  (right arm) Cuff size:   regular  Vitals Entered By: Linde Gillis CMA Duncan Dull) (September 12, 2010 12:27 PM) CC: immunizations    History of Present Illness: 65 yo here to discuss immunizations.  Travelling to [place] to visit her daughter and grandchildren in January.    No previous h/o reaction to immunizations, no egg allergy.  May be doing some camping while there, otherwise will likely stay in urbanized area.  Current Medications (verified): 1)  Omeprazole 40 Mg Cpdr (Omeprazole) .... Take 1 Tablet By Mouth Two Times A Day 2)  Trazodone Hcl 100 Mg Tabs (Trazodone Hcl) .... 200 Mg.  Once Daily 3)  Lamotrigine 200 Mg Tabs (Lamotrigine) .... 225 Mg. Once Daily 4)  Vitamin D 1000 Unit  Tabs (Cholecalciferol) .... Take 3000 International Units in The Morning and 2000 International Units At Bedtime 5)  Lovaza 1 Gm Caps (Omega-3-Acid Ethyl Esters) .... 2 Grams . Two Times A Day 6)  Simvastatin 40 Mg Tabs (Simvastatin) .... Take 1 Tablet By Mouth Once A Day 7)  Niaspan 500 Mg Cr-Tabs (Niacin (Antihyperlipidemic)) .Marland Kitchen.. 1000 Mg. Once Daily 8)  Gabapentin 300 Mg  Caps (Gabapentin) .... Take Two Tablets By Mouth At Bedtime 9)  Lexapro 20 Mg Tabs (Escitalopram Oxalate) .Marland Kitchen.. 1 By Mouth Daily 10)  Alendronate Sodium 70 Mg  Tabs (Alendronate Sodium) .Marland Kitchen.. 1 By Mouth Q Week . Take With 8 Ounces of Water On and Empty Stomach 11)  Aggrenox 25-200 Mg Xr12h-Cap (Aspirin-Dipyridamole) .... Take One Tablet By Mouth Daily 12)  Vicodin 5-500 Mg Tabs (Hydrocodone-Acetaminophen) .Marland Kitchen.. 1-2 Tabs By Mouth Three Times A Day With Food As Needed For Back Pain 13)  Doxycycline Hyclate 100 Mg Caps (Doxycycline Hyclate) .... Take 1 Tab By Mouth Daily.  Begin 1-2 Days Prior  To Travel, Continue Daily While in [place] and Continue For 4 Weeks After Returning.  Allergies (verified): No Known Drug Allergies  Past History:  Past Medical History: Last updated: 11/28/2009 Mood Disorder, NOS Fibromyalgia Colonic polyps, hx of GERD Hyperlipidemia IBS  Past Surgical History: Last updated: 11/28/2009 Thyroidectomy, partial in 2007 Cosmetic surgery  Cholecystectomy 1995  Family History: Last updated: 11/28/2009 Family History of Alcoholism/Addiction Family History of Anxiety Family History of Asthma Family History Depression  Social History: Last updated: 11/28/2009 Moved here from [place] several months ago to be closer to her LOVELY family - daughter Currie Paris. Retired Visual merchandiser.  Alcohol use-yes Non smoker.  Review of Systems      See HPI  Physical Exam  General:  alert, well-developed, well-nourished, and well-hydrated.   Psych:  alert, very pleasant, good eye contact.   Impression & Recommendations:  Problem # 1:  HEALTH SCREENING (ICD-V70.0) Assessment Unchanged Time spent with patient 25 minutes, more than 50% of this time was spent counseling patient on CDC recommendations for travel to [place].  Travelling to [place] in January. Per CDC recommendations, needs MMR, DTP, Hep A, Hep B, Typhoid, (possibly rabies) vaccinations along with Malaria prophylaxis. Twinrix given today, along with Td. MMR and pertusis titers drawn. Refered to health dept for Typhoid and rabies vaccinations. Given rx for Doxycline for  malaria prophlyaxis and discussed instructions. Orders: Venipuncture (95188) T- * Misc. Laboratory test [phone]) T- * Misc. Laboratory test [id number])  Complete Medication List: 1)  Omeprazole 40 Mg Cpdr (Omeprazole) .... Take 1 tablet by mouth two times a day 2)  Trazodone Hcl 100 Mg Tabs (Trazodone hcl) .... 200 mg.  once daily 3)  Lamotrigine 200 Mg Tabs (Lamotrigine) .... 225 mg. once daily 4)   Vitamin D 1000 Unit Tabs (Cholecalciferol) .... Take 3000 international units in the morning and 2000 international units at bedtime 5)  Lovaza 1 Gm Caps (Omega-3-acid ethyl esters) .... 2 grams . two times a day 6)  Simvastatin 40 Mg Tabs (Simvastatin) .... Take 1 tablet by mouth once a day 7)  Niaspan 500 Mg Cr-tabs (Niacin (antihyperlipidemic)) .Marland Kitchen.. 1000 mg. once daily 8)  Gabapentin 300 Mg Caps (Gabapentin) .... Take two tablets by mouth at bedtime 9)  Lexapro 20 Mg Tabs (Escitalopram oxalate) .Marland Kitchen.. 1 by mouth daily 10)  Alendronate Sodium 70 Mg Tabs (Alendronate sodium) .Marland Kitchen.. 1 by mouth q week . take with 8 ounces of water on and empty stomach 11)  Aggrenox 25-200 Mg Xr12h-cap (Aspirin-dipyridamole) .... Take one tablet by mouth daily 12)  Vicodin 5-500 Mg Tabs (Hydrocodone-acetaminophen) .Marland Kitchen.. 1-2 tabs by mouth three times a day with food as needed for back pain 13)  Doxycycline Hyclate 100 Mg Caps (Doxycycline hyclate) .... Take 1 tab by mouth daily.  begin 1-2 days prior to travel, continue daily while in [place] and continue for 4 weeks after returning.  Other Orders: TD Toxoids IM 7 YR + (01093) Admin 1st Vaccine (23557) TwinRix 1ml ( Hep A&B Adult dose) (32202) Admin of Any Addtl Vaccine (54270) Prescriptions: DOXYCYCLINE HYCLATE 100 MG CAPS (DOXYCYCLINE HYCLATE) Take 1 tab by mouth daily.  Begin 1-2 days prior to travel, continue daily while in [place] and continue for 4 weeks after returning.  #90 x 1   Entered and Authorized by:   Ruthe Mannan MD   Signed by:   Ruthe Mannan MD on 09/12/2010   Method used:   Print then Give to Patient   RxID:   [id number] DOXYCYCLINE HYCLATE 100 MG CAPS (DOXYCYCLINE HYCLATE) Take 1 tab by mouth daily.  Begin 1-2 days prior to travel, continue daily while in [place] and continue for 4 weeks after returning.  #90 x 1   Entered and Authorized by:   Ruthe Mannan MD   Signed by:   Ruthe Mannan MD on 09/12/2010   Method used:   [facility]        [facility] [address] [id number]       [address]       [place], [place]  [address]       Ph: [phone] or [phone]       Fax: [phone]   RxID:   [id number]    Orders Added: 1)  Venipuncture [24235] 2)  T- * Misc. Laboratory test [99999] 3)  T- * Misc. Laboratory test [99999] 4)  TD Toxoids IM 7 YR + [90714] 5)  Admin 1st Vaccine [90471] 6)  TwinRix 1ml ( Hep A&B Adult dose) [90636] 7)  Admin of Any Addtl Vaccine [90472] 8)  Est. Patient Level IV [36144]   Immunizations Administered:  Tetanus Vaccine:    Vaccine Type: Td    Site: right deltoid    Mfr: Sanofi Pasteur    Dose: 0.5 ml    Route: IM    Given by: Linde Gillis CMA (AAMA)  Exp. Date: 01/23/2012    Lot #: [id number]    VIS given: 08/23/08 version given September 12, 2010.  TwinRix # 1:    Vaccine Type: TwinRix    Site: left deltoid    Mfr: GlaxoSmithKline    Dose: 0.5 ml    Route: IM    Given by: Linde Gillis CMA (AAMA)    Exp. Date: 08/08/2012    Lot #: [id number]    VIS given: 06/24/07 version given September 12, 2010.   Immunizations Administered:  Tetanus Vaccine:    Vaccine Type: Td    Site: right deltoid    Mfr: Sanofi Pasteur    Dose: 0.5 ml    Route: IM    Given by: Linde Gillis CMA (AAMA)    Exp. Date: 01/23/2012    Lot #: [id number]    VIS given: 08/23/08 version given September 12, 2010.  TwinRix # 1:    Vaccine Type: TwinRix    Site: left deltoid    Mfr: GlaxoSmithKline    Dose: 0.5 ml    Route: IM    Given by: Linde Gillis CMA (AAMA)    Exp. Date: 08/08/2012    Lot #: [id number]    VIS given: 06/24/07 version given September 12, 2010.  Current Allergies (reviewed today): No known allergies   Last Pneumovax:  historical (11/22/2008 12:37:00 PM) Pneumovax Result Date:  09/12/2010 Pneumovax Result:  given

## 2010-11-05 NOTE — Progress Notes (Signed)
Summary: Rx Lamictal & Niaspan  Phone Note Refill Request Call back at [phone] Message from:  Patient on April 04, 2010 3:29 PM  Refills Requested: Medication #1:  LAMOTRIGINE 200 MG TABS 225 mg. once daily   Supply Requested: 1 month  Medication #2:  NIASPAN 500 MG CR-TABS 1000 mg. once daily   Supply Requested: 1 month Patient is requesting a 30 day supply of [facility]  Please advise   Method Requested: Electronic Initial call taken by: Linde Gillis CMA Duncan Dull),  April 04, 2010 3:39 PM    Prescriptions: LAMOTRIGINE 200 MG TABS (LAMOTRIGINE) 225 mg. once daily  #30 x 3   Entered and Authorized by:   Ruthe Mannan MD   Signed by:   Ruthe Mannan MD on 04/04/2010   Method used:   [facility]        [facility]       [address]       [place], [place]  [address]       Ph: [phone] or [phone]       Fax: [phone]   RxID:   [id number] NIASPAN 500 MG CR-TABS (NIACIN (ANTIHYPERLIPIDEMIC)) 1000 mg. once daily  #30 x 3   Entered and Authorized by:   Ruthe Mannan MD   Signed by:   Ruthe Mannan MD on 04/04/2010   Method used:   [facility]        [facility]       [address]       [place], [place]  [address]       Ph: [phone] or [phone]       Fax: [phone]   RxID:   [id number]   Appended Document: Rx Lamictal & Niaspan Quantity on niaspan changed to 60 at [facility].

## 2010-11-05 NOTE — Miscellaneous (Signed)
Summary: Orders Update   Clinical Lists Changes  Orders: Added new Referral order of Radiology Referral (Radiology) - Signed 

## 2010-11-05 NOTE — Progress Notes (Signed)
Summary: needs written script for lovaza  Phone Note Refill Request Call back at [phone] Message from:  Patient  Refills Requested: Medication #1:  LOVAZA 1 GM CAPS 2 grams . two times a day Pt needs a written 90 day script that she can mail in.  Please call when ready.  Initial call taken by: Lowella Petties CMA,  May 07, 2010 3:31 PM    Prescriptions: LOVAZA 1 GM CAPS (OMEGA-3-ACID ETHYL ESTERS) 2 grams . two times a day  #360 x 3   Entered and Authorized by:   Ruthe Mannan MD   Signed by:   Ruthe Mannan MD on 05/08/2010   Method used:   Print then Give to Patient   RxID:   [id number]   Appended Document: needs written script for lovaza Patient notified Rx ready for pick up will be left at front desk.

## 2010-11-05 NOTE — Progress Notes (Signed)
Summary: going out of country  Phone Note Call from Patient   Caller: Patient Call For: Ruthe Mannan MD Summary of Call: Patient is going out of the country next month. She wanted to schedule appt to discuss this with you and also go over any immunizations she may need. I scheduled appt for this thursday. Patient asked that I send you a note so that you are aware.  Initial call taken by: Melody Comas,  September 09, 2010 10:30 AM  Follow-up for Phone Call        thank you. Ruthe Mannan MD  September 09, 2010 10:31 AM

## 2010-11-05 NOTE — Letter (Signed)
Summary: Records Dated 05-01-08 thru 11-17-09/[facility]  Records Dated 05-01-08 thru 11-17-09/[facility]   Imported By: Lanelle Bal 01/21/2010 13:14:28  _____________________________________________________________________  External Attachment:    Type:   Image     Comment:   External Document

## 2010-11-05 NOTE — Medication Information (Signed)
Summary: Approval for Lexapro/[facility]  Approval for [employer]   Imported By: Lanelle Bal 01/16/2010 12:56:52  _____________________________________________________________________  External Attachment:    Type:   Image     Comment:   External Document

## 2010-11-05 NOTE — Progress Notes (Signed)
Summary: call a nurse   Phone Note Call from Patient   Summary of Call: Triage Record Num: [id number] Operator: Albertine Grates Patient Name: Nicole Perkins Call Date & Time: 07/05/2010 6:49:05PM Patient Phone: [phone] PCP: Patient Gender: Female PCP Fax : Patient DOB: June 06, 1946 Practice Name: [facility] Reason for Call: Had TIa 1 month ago. Has tingling in feet since 9-30. Is intermittent. Is both feet. Has had 2 CVA's. Advised to go to ED. Protocol(s) Used: Numbness or Tingling Recommended Outcome per Protocol: See ED Immediately Reason for Outcome: Had recent (within last 24 hours) episode(s) of paralysis, weakness, numbness/tingling of an arm or leg or the face, especially on same side of body, AND NOW RESOLVED Care Advice:  ~ Initial call taken by: Melody Comas,  July 08, 2010 8:55 AM    s

## 2010-11-05 NOTE — Assessment & Plan Note (Signed)
Summary: NEUROPATHY/CLE   Vital Signs:  Patient profile:   65 year old female Height:      64.25 inches Weight:      184 pounds BMI:     31.45 Temp:     97.8 degrees F oral Pulse rate:   68 / minute Pulse rhythm:   regular BP sitting:   110 / 62  (right arm) Cuff size:   large  Vitals Entered By: Linde Gillis CMA Duncan Dull) (June 04, 2010 9:49 AM) CC: ? Neuropathy   History of Present Illness: 65 yo with known h/o fibromyalgia, recent TIA here for worsening neuropathic pain.  Pain worsened in both of her legs several days ago, from hips to her ankles. No known trauma or injury.  No LE weakness. Feels like a burning, aching and tingling. On Gabapentin 300 mg at bedtime, increased herself to 300 mg two times a day and noticed immediate improvement in symptoms. Denies any urinary symptoms.  Current Medications (verified): 1)  Omeprazole 40 Mg Cpdr (Omeprazole) .... Take 1 Tablet By Mouth Two Times A Day 2)  Trazodone Hcl 100 Mg Tabs (Trazodone Hcl) .... 200 Mg.  Once Daily 3)  Lamotrigine 200 Mg Tabs (Lamotrigine) .... 225 Mg. Once Daily 4)  Vitamin D 1000 Unit  Tabs (Cholecalciferol) .... Take 1 Tablet By Mouth in Am and Two Tabs By Mouth in Pm 5)  Lovaza 1 Gm Caps (Omega-3-Acid Ethyl Esters) .... 2 Grams . Two Times A Day 6)  Calcium Carbonate-Vitamin D 600-400 Mg-Unit  Tabs (Calcium Carbonate-Vitamin D) .... Take 1 Tablet By Mouth Two Times A Day 7)  Simvastatin 40 Mg Tabs (Simvastatin) .... Take 1 Tablet By Mouth Once A Day 8)  Niaspan 500 Mg Cr-Tabs (Niacin (Antihyperlipidemic)) .Marland Kitchen.. 1000 Mg. Once Daily 9)  Gabapentin 300 Mg  Caps (Gabapentin) .Marland Kitchen.. 1 Tab By Mouth Two Times A Day 10)  Lexapro 20 Mg Tabs (Escitalopram Oxalate) .Marland Kitchen.. 1 By Mouth Daily 11)  Alendronate Sodium 70 Mg  Tabs (Alendronate Sodium) .Marland Kitchen.. 1 By Mouth Q Week . Take With 8 Ounces of Water On and Empty Stomach 12)  Aggrenox 25-200 Mg Xr12h-Cap (Aspirin-Dipyridamole) .... Take One Tablet By Mouth  Daily  Allergies (verified): No Known Drug Allergies  Past History:  Past Medical History: Last updated: 11/28/2009 Mood Disorder, NOS Fibromyalgia Colonic polyps, hx of GERD Hyperlipidemia IBS  Past Surgical History: Last updated: 11/28/2009 Thyroidectomy, partial in 2007 Cosmetic surgery  Cholecystectomy 1995  Family History: Last updated: 11/28/2009 Family History of Alcoholism/Addiction Family History of Anxiety Family History of Asthma Family History Depression  Social History: Last updated: 11/28/2009 Moved here from [place] several months ago to be closer to her LOVELY family - daughter Currie Paris. Retired Visual merchandiser.  Alcohol use-yes Non smoker.  Review of Systems      See HPI MS:  Denies loss of strength. Neuro:  Complains of tingling; denies poor balance, tremors, visual disturbances, and weakness.  Physical Exam  General:  alert, pleasant, NAD. Msk:  No deformity or scoliosis noted of thoracic or lumbar spine.   Extremities:  No clubbing, cyanosis, edema, or deformity noted with normal full range of motion of all joints.   Neurologic:  alert & oriented X3, cranial nerves II-XII intact, strength normal in all extremities, sensation intact to light touch, sensation intact to pinprick, gait normal, and DTRs symmetrical and normal.   Psych:  alert, very pleasant, good eye contact.   Impression & Recommendations:  Problem # 1:  PAIN IN SOFT TISSUES OF LIMB (ICD-729.5) Assessment Deteriorated Time spent with patient 25 minutes, more than 50% of this time was spent counseling patient on fibromyalgia and neuropathic pain.  Will continue with increased dose of Gabapentin 300 mg two times a day, discussed that we have plenty of room to titrate up dose. She will touch base with me at end of next week, may increase to 600 mg two times a day.  Complete Medication List: 1)  Omeprazole 40 Mg Cpdr (Omeprazole) .... Take 1 tablet by mouth  two times a day 2)  Trazodone Hcl 100 Mg Tabs (Trazodone hcl) .... 200 mg.  once daily 3)  Lamotrigine 200 Mg Tabs (Lamotrigine) .... 225 mg. once daily 4)  Vitamin D 1000 Unit Tabs (Cholecalciferol) .... Take 1 tablet by mouth in am and two tabs by mouth in pm 5)  Lovaza 1 Gm Caps (Omega-3-acid ethyl esters) .... 2 grams . two times a day 6)  Calcium Carbonate-vitamin D 600-400 Mg-unit Tabs (Calcium carbonate-vitamin d) .... Take 1 tablet by mouth two times a day 7)  Simvastatin 40 Mg Tabs (Simvastatin) .... Take 1 tablet by mouth once a day 8)  Niaspan 500 Mg Cr-tabs (Niacin (antihyperlipidemic)) .Marland Kitchen.. 1000 mg. once daily 9)  Gabapentin 300 Mg Caps (Gabapentin) .Marland Kitchen.. 1 tab by mouth two times a day 10)  Lexapro 20 Mg Tabs (Escitalopram oxalate) .Marland Kitchen.. 1 by mouth daily 11)  Alendronate Sodium 70 Mg Tabs (Alendronate sodium) .Marland Kitchen.. 1 by mouth q week . take with 8 ounces of water on and empty stomach 12)  Aggrenox 25-200 Mg Xr12h-cap (Aspirin-dipyridamole) .... Take one tablet by mouth daily Prescriptions: GABAPENTIN 300 MG  CAPS (GABAPENTIN) 1 tab by mouth two times a day  #300 x 3   Entered and Authorized by:   Ruthe Mannan MD   Signed by:   Ruthe Mannan MD on 06/04/2010   Method used:   Print then Give to Patient   RxID:   [id number] GABAPENTIN 300 MG  CAPS (GABAPENTIN) 1 tab by mouth qhs  #90 x 3   Entered and Authorized by:   Ruthe Mannan MD   Signed by:   Ruthe Mannan MD on 06/04/2010   Method used:   [facility]        [facility]  [address] [facility]ret[facility]       [address]       [place], [place]  [address]       Ph: [phone] or [phone]       Fax: [phone]   RxID:   [id number]   Current Allergies (reviewed today): No known allergies

## 2010-11-05 NOTE — Assessment & Plan Note (Signed)
Summary: CHECK MOLE ON CHEST/[facility]H   Vital Signs:  Patient profile:   65 year old female Height:      64.25 inches Weight:      163 pounds BMI:     27.86 Temp:     97.7 degrees F oral Pulse rate:   60 / minute Pulse rhythm:   regular BP sitting:   100 / 70  (right arm) Cuff size:   regular  Vitals Entered By: Linde Gillis CMA Duncan Dull) (August 16, 2010 12:34 PM) CC: check mole on chest   History of Present Illness: 65 yo with mole on her chest.  Has been there for months but last several weeks, becoming itchy and feels like it is changing.  Borders appear more irregular.  Color is about the same.  Overall, doing well.  Has lost 20 pounds and moved to [place].  Very happy.  Current Medications (verified): 1)  Omeprazole 40 Mg Cpdr (Omeprazole) .... Take 1 Tablet By Mouth Two Times A Day 2)  Trazodone Hcl 100 Mg Tabs (Trazodone Hcl) .... 200 Mg.  Once Daily 3)  Lamotrigine 200 Mg Tabs (Lamotrigine) .... 225 Mg. Once Daily 4)  Vitamin D 1000 Unit  Tabs (Cholecalciferol) .... Take 3000 International Units in The Morning and 2000 International Units At Bedtime 5)  Lovaza 1 Gm Caps (Omega-3-Acid Ethyl Esters) .... 2 Grams . Two Times A Day 6)  Simvastatin 40 Mg Tabs (Simvastatin) .... Take 1 Tablet By Mouth Once A Day 7)  Niaspan 500 Mg Cr-Tabs (Niacin (Antihyperlipidemic)) .Marland Kitchen.. 1000 Mg. Once Daily 8)  Gabapentin 300 Mg  Caps (Gabapentin) .... Take Two Tablets By Mouth At Bedtime 9)  Lexapro 20 Mg Tabs (Escitalopram Oxalate) .Marland Kitchen.. 1 By Mouth Daily 10)  Alendronate Sodium 70 Mg  Tabs (Alendronate Sodium) .Marland Kitchen.. 1 By Mouth Q Week . Take With 8 Ounces of Water On and Empty Stomach 11)  Aggrenox 25-200 Mg Xr12h-Cap (Aspirin-Dipyridamole) .... Take One Tablet By Mouth Daily 12)  Vicodin 5-500 Mg Tabs (Hydrocodone-Acetaminophen) .Marland Kitchen.. 1-2 Tabs By Mouth Three Times A Day With Food As Needed For Back Pain  Allergies (verified): No Known Drug Allergies  Review of Systems      See  HPI General:  Denies fatigue and fever.  Physical Exam  General:  alert, well-developed, well-nourished, and well-hydrated.   Skin:  color normal.  dysplastic nevi on chest. Psych:  alert, very pleasant, good eye contact.   Impression & Recommendations:  Problem # 1:  NEVUS, ATYPICAL (ICD-216.9) Assessment New Will refer to dermatologist in [place] area. Orders: Dermatology Referral (Derma)  Complete Medication List: 1)  Omeprazole 40 Mg Cpdr (Omeprazole) .... Take 1 tablet by mouth two times a day 2)  Trazodone Hcl 100 Mg Tabs (Trazodone hcl) .... 200 mg.  once daily 3)  Lamotrigine 200 Mg Tabs (Lamotrigine) .... 225 mg. once daily 4)  Vitamin D 1000 Unit Tabs (Cholecalciferol) .... Take 3000 international units in the morning and 2000 international units at bedtime 5)  Lovaza 1 Gm Caps (Omega-3-acid ethyl esters) .... 2 grams . two times a day 6)  Simvastatin 40 Mg Tabs (Simvastatin) .... Take 1 tablet by mouth once a day 7)  Niaspan 500 Mg Cr-tabs (Niacin (antihyperlipidemic)) .Marland Kitchen.. 1000 mg. once daily 8)  Gabapentin 300 Mg Caps (Gabapentin) .... Take two tablets by mouth at bedtime 9)  Lexapro 20 Mg Tabs (Escitalopram oxalate) .Marland Kitchen.. 1 by mouth daily 10)  Alendronate Sodium 70 Mg Tabs (Alendronate sodium) .Marland KitchenMarland KitchenMarland Kitchen 1  by mouth q week . take with 8 ounces of water on and empty stomach 11)  Aggrenox 25-200 Mg Xr12h-cap (Aspirin-dipyridamole) .... Take one tablet by mouth daily 12)  Vicodin 5-500 Mg Tabs (Hydrocodone-acetaminophen) .Marland Kitchen.. 1-2 tabs by mouth three times a day with food as needed for back pain  Patient Instructions: 1)  Please stop by to see Shirlee Limerick on your way out.   Orders Added: 1)  Dermatology Referral [Derma] 2)  Est. Patient Level III [[address]]    Current Allergies (reviewed today): No known allergies

## 2010-11-05 NOTE — Assessment & Plan Note (Signed)
Summary: F/U  DLO   Vital Signs:  Patient profile:   66 year old female Height:      64.25 inches Weight:      186.13 pounds BMI:     31.82 Temp:     99.2 degrees F oral Pulse rate:   76 / minute Pulse rhythm:   regular BP sitting:   104 / 68  (left arm) Cuff size:   large  Vitals Entered By: Lewanda Rife LPN (January 21, 2010 12:21 PM) CC: follow-up visit left thigh pain   History of Present Illness: 65 yo here for follow up left thigh pain.  Last saw her on 3/25 and started Gabapentin 300 mg qhs as that time.  Advised her that it was ok to titrate dose up if she felt it was necessary.  She did not titrate dose because 300 mg qhs has provided significant relief!  Not having any pain in her thigh at this time.  She feels great.   No known side effects from the gabapentin.  Osteopenia- has been taking Caltrate.  Not had a DEXA scan in years.    Current Medications (verified): 1)  Omeprazole 40 Mg Cpdr (Omeprazole) .... Take 1 Tablet By Mouth Two Times A Day 2)  Trazodone Hcl 100 Mg Tabs (Trazodone Hcl) .... 200 Mg.  Once Daily 3)  Lamotrigine 200 Mg Tabs (Lamotrigine) .... 225 Mg. Once Daily 4)  Vitamin D 1000 Unit  Tabs (Cholecalciferol) .... Take 1 Tablet By Mouth in Am and Two Tabs By Mouth in Pm 5)  Lovaza 1 Gm Caps (Omega-3-Acid Ethyl Esters) .... 2 Grams . Two Times A Day 6)  Calcium Carbonate-Vitamin D 600-400 Mg-Unit  Tabs (Calcium Carbonate-Vitamin D) .... Take 1 Tablet By Mouth Two Times A Day 7)  Simvastatin 40 Mg Tabs (Simvastatin) .... Take 1 Tablet By Mouth Once A Day 8)  Niaspan 500 Mg Cr-Tabs (Niacin (Antihyperlipidemic)) .Marland Kitchen.. 1000 Mg. Once Daily 9)  Mobic 15 Mg Tabs (Meloxicam) .Marland Kitchen.. 1 Tab By Mouth Daily As Needed Pain. 10)  Lexapro 10 Mg Tabs (Escitalopram Oxalate) .... Take 1 Tablet By Mouth Once A Day 11)  Gabapentin 300 Mg  Caps (Gabapentin) .Marland Kitchen.. 1 Tab By Mouth Qhs 12)  Advil 200 Mg Tabs (Ibuprofen) .... Otc As Directed.  Allergies (verified): No Known Drug  Allergies  Review of Systems      See HPI General:  Denies fever. MS:  Denies joint pain, joint redness, joint swelling, loss of strength, and muscle weakness. Psych:  Denies anxiety and depression.   Impression & Recommendations:  Problem # 1:  PAIN IN SOFT TISSUES OF LIMB (ICD-729.5) Assessment Improved Time spent with patient 25 minutes, more than 50% of this time was spent discussing her pain sydrome.   Likely related to her fibromyalgia, has found significant with relief with Gabapentin 300 mg at bedtime! Will continue current dose.  She will call me in a month or so to let me know how she is doing.  Problem # 2:  OSTEOPENIA (ICD-733.90) Assessment: Unchanged Set up DEXA scan today. Her updated medication list for this problem includes:    Vitamin D 1000 Unit Tabs (Cholecalciferol) .Marland Kitchen... Take 1 tablet by mouth in am and two tabs by mouth in pm    Calcium Carbonate-vitamin D 600-400 Mg-unit Tabs (Calcium carbonate-vitamin d) .Marland Kitchen... Take 1 tablet by mouth two times a day  Orders: Radiology Referral (Radiology)  Complete Medication List: 1)  Omeprazole 40 Mg Cpdr (Omeprazole) .... Take 1  tablet by mouth two times a day 2)  Trazodone Hcl 100 Mg Tabs (Trazodone hcl) .... 200 mg.  once daily 3)  Lamotrigine 200 Mg Tabs (Lamotrigine) .... 225 mg. once daily 4)  Vitamin D 1000 Unit Tabs (Cholecalciferol) .... Take 1 tablet by mouth in am and two tabs by mouth in pm 5)  Lovaza 1 Gm Caps (Omega-3-acid ethyl esters) .... 2 grams . two times a day 6)  Calcium Carbonate-vitamin D 600-400 Mg-unit Tabs (Calcium carbonate-vitamin d) .... Take 1 tablet by mouth two times a day 7)  Simvastatin 40 Mg Tabs (Simvastatin) .... Take 1 tablet by mouth once a day 8)  Niaspan 500 Mg Cr-tabs (Niacin (antihyperlipidemic)) .Marland Kitchen.. 1000 mg. once daily 9)  Mobic 15 Mg Tabs (Meloxicam) .Marland Kitchen.. 1 tab by mouth daily as needed pain. 10)  Lexapro 10 Mg Tabs (Escitalopram oxalate) .... Take 1 tablet by mouth once a  day 11)  Gabapentin 300 Mg Caps (Gabapentin) .Marland Kitchen.. 1 tab by mouth qhs 12)  Advil 200 Mg Tabs (Ibuprofen) .... Otc as directed.  Patient Instructions: 1)  Great to see you, Ms. Billick. 2)  Please stop by to see Shirlee Limerick on your way out to set up your bone density scan. Prescriptions: GABAPENTIN 300 MG  CAPS (GABAPENTIN) 1 tab by mouth qhs  #90 x 3   Entered and Authorized by:   Ruthe Mannan MD   Signed by:   Ruthe Mannan MD on 01/21/2010   Method used:   Faxed to ...       [facility])       [address] [address]       [place], [place]  [address]       Ph: [phone]       Fax: [phone]   RxID:   [id number] GABAPENTIN 300 MG  CAPS (GABAPENTIN) 1 tab by mouth qhs  #90 x 0   Entered and Authorized by:   Ruthe Mannan MD   Signed by:   Ruthe Mannan MD on 01/21/2010   Method used:   [facility]        [facility]  [address] [facility]       [address]       [place], [place]  [address]       Ph: [phone] or [phone]       Fax: [phone]   RxID:   [id number]   Current Allergies (reviewed today): No known allergies

## 2010-11-05 NOTE — Letter (Signed)
Summary: Results Follow up Letter  [place] [facility]  [address] [place], [place] [address]   Phone: [phone]  Fax: [phone]    12/18/2009 MRN: [id number]    [facility] [address] [place], [place]  [address]    Dear Ms. Gatchalian,  The following are the results of your recent test(s):  Test         Result    Pap Smear:        Normal __X___  Not Normal _____ Comments:   Repeat in 2 years. ______________________________________________________ Cholesterol: LDL(Bad cholesterol):         Your goal is less than:         HDL (Good cholesterol):       Your goal is more than: Comments:  ______________________________________________________ Mammogram:        Normal _____  Not Normal _____ Comments:  ___________________________________________________________________ Hemoccult:        Normal _____  Not normal _______ Comments:    _____________________________________________________________________ Other Tests:    We routinely do not discuss normal results over the telephone.  If you desire a copy of the results, or you have any questions about this information we can discuss them at your next office visit.   Sincerely,    Marne A. Milinda Antis, M.D.  MAT:lsf

## 2010-11-05 NOTE — Progress Notes (Signed)
  Phone Note Call from Patient   Caller: Patient Summary of Call: Patient called me at home this weekend, stating that she has had dysuria and pressure for two days that is getting progressively worse.  No hematuria, fever, nausea or vomiting.  Would like abx called in until she can be seen tomorrow.  Although we typically don't call in abx with out being seen, has no red flag symptoms and pt is very reliable.  Will send in cipro and pyridum. Initial call taken by: Ruthe Mannan MD,  March 10, 2010 11:18 AM    New/Updated Medications: CIPRO 500 MG TABS (CIPROFLOXACIN HCL) 1 by mouth 2 times daily x 7 days PYRIDIUM 100 MG TABS (PHENAZOPYRIDINE HCL) 1 tab by mouth three times a day x 2 days Prescriptions: PYRIDIUM 100 MG TABS (PHENAZOPYRIDINE HCL) 1 tab by mouth three times a day x 2 days  #6 x 0   Entered and Authorized by:   Ruthe Mannan MD   Signed by:   Ruthe Mannan MD on 03/10/2010   Method used:   [facility]        [facility]  [address] ([facility])       [address]       [place], [place]  [address]       Ph: [phone] or [phone]       Fax: [phone]   RxID:   [id number] CIPRO 500 MG TABS (CIPROFLOXACIN HCL) 1 by mouth 2 times daily x 7 days  #14 x 0   Entered and Authorized by:   Ruthe Mannan MD   Signed by:   Ruthe Mannan MD on 03/10/2010   Method used:   [facility]        [facility]  [address] ([facility]       [address]       [place], [place]  [address]       Ph: [phone] or [phone]       Fax: [phone]   RxID:   [id number]

## 2010-11-05 NOTE — Assessment & Plan Note (Signed)
Summary: sacroiliac pain   Vital Signs:  Patient profile:   65 year old female Height:      64.25 inches Weight:      183 pounds BMI:     31.28 Temp:     97.6 degrees F oral Pulse rate:   76 / minute Pulse rhythm:   regular Resp:     16 per minute BP sitting:   120 / 70  (left arm) Cuff size:   regular  Vitals Entered By: Lanier Prude, CMA(AAMA) (June 22, 2010 11:28 AM) CC: Lft side LBP X 1 wk worsening past 48 hours Is Patient Diabetic? No Pain Assessment Patient in pain? yes     Location: lower back   Primary Care Provider:  Ruthe Mannan MD  CC:  Lft side LBP X 1 wk worsening past 48 hours.  History of Present Illness: 65 yo WF presents for L sided LBP x 1 wk.  Denies any radiation of pain into her buttocks or down her legs.  She has worsening pain wtth sitting.  She has throbbing pain.  Has had this in the past.  She denies any trauma or overuse when this first started.  She is having problems getting comfortable at night.  Trazadone is helping.  She cannot take NSAIDs  b/c she is on Aggrenox.  Tylenol arthritis is not helping.    Current Medications (verified): 1)  Omeprazole 40 Mg Cpdr (Omeprazole) .... Take 1 Tablet By Mouth Two Times A Day 2)  Trazodone Hcl 100 Mg Tabs (Trazodone Hcl) .... 200 Mg.  Once Daily 3)  Lamotrigine 200 Mg Tabs (Lamotrigine) .... 225 Mg. Once Daily 4)  Vitamin D 1000 Unit  Tabs (Cholecalciferol) .... Take 1 Tablet By Mouth in Am and Two Tabs By Mouth in Pm 5)  Lovaza 1 Gm Caps (Omega-3-Acid Ethyl Esters) .... 2 Grams . Two Times A Day 6)  Calcium Carbonate-Vitamin D 600-400 Mg-Unit  Tabs (Calcium Carbonate-Vitamin D) .... Take 1 Tablet By Mouth Two Times A Day 7)  Simvastatin 40 Mg Tabs (Simvastatin) .... Take 1 Tablet By Mouth Once A Day 8)  Niaspan 500 Mg Cr-Tabs (Niacin (Antihyperlipidemic)) .Marland Kitchen.. 1000 Mg. Once Daily 9)  Gabapentin 300 Mg  Caps (Gabapentin) .Marland Kitchen.. 1 Tab By Mouth Two Times A Day 10)  Lexapro 20 Mg Tabs (Escitalopram  Oxalate) .Marland Kitchen.. 1 By Mouth Daily 11)  Alendronate Sodium 70 Mg  Tabs (Alendronate Sodium) .Marland Kitchen.. 1 By Mouth Q Week . Take With 8 Ounces of Water On and Empty Stomach 12)  Aggrenox 25-200 Mg Xr12h-Cap (Aspirin-Dipyridamole) .... Take One Tablet By Mouth Daily  Allergies (verified): No Known Drug Allergies  Past History:  Past Medical History: Reviewed history from 11/28/2009 and no changes required. Mood Disorder, NOS Fibromyalgia Colonic polyps, hx of GERD Hyperlipidemia IBS  Social History: Reviewed history from 11/28/2009 and no changes required. Moved here from [place] several months ago to be closer to her LOVELY family - daughter Currie Paris. Retired Visual merchandiser.  Alcohol use-yes Non smoker.  Review of Systems      See HPI  Physical Exam  General:  alert, well-developed, well-nourished, and well-hydrated.   Head:  normocephalic and atraumatic.   Lungs:  Normal respiratory effort, chest expands symmetrically. Lungs are clear to auscultation, no crackles or wheezes. Heart:  Normal rate and regular rhythm. S1 and S2 normal without gallop, murmur, click, rub or other extra sounds. Msk:  point tender over the L sacroiliac notches with full L hip  flexion and external rotation.  pain with sitting.   Extremities:  no LE edema Neurologic:  gait normal.   Skin:  color normal.     Impression & Recommendations:  Problem # 1:  SACROILIITIS (ICD-720.2) Treat with RX pain meds since she is unable to take NSAIDs and Tylenol is not helping. Call PCP if not improving in  ~10 days.  Avoid prolonged sitting.  Start gentle stretching.  Use ice or icy hot as needed.  Complete Medication List: 1)  Omeprazole 40 Mg Cpdr (Omeprazole) .... Take 1 tablet by mouth two times a day 2)  Trazodone Hcl 100 Mg Tabs (Trazodone hcl) .... 200 mg.  once daily 3)  Lamotrigine 200 Mg Tabs (Lamotrigine) .... 225 mg. once daily 4)  Vitamin D 1000 Unit Tabs (Cholecalciferol) .... Take  1 tablet by mouth in am and two tabs by mouth in pm 5)  Lovaza 1 Gm Caps (Omega-3-acid ethyl esters) .... 2 grams . two times a day 6)  Calcium Carbonate-vitamin D 600-400 Mg-unit Tabs (Calcium carbonate-vitamin d) .... Take 1 tablet by mouth two times a day 7)  Simvastatin 40 Mg Tabs (Simvastatin) .... Take 1 tablet by mouth once a day 8)  Niaspan 500 Mg Cr-tabs (Niacin (antihyperlipidemic)) .Marland Kitchen.. 1000 mg. once daily 9)  Gabapentin 300 Mg Caps (Gabapentin) .Marland Kitchen.. 1 tab by mouth two times a day 10)  Lexapro 20 Mg Tabs (Escitalopram oxalate) .Marland Kitchen.. 1 by mouth daily 11)  Alendronate Sodium 70 Mg Tabs (Alendronate sodium) .Marland Kitchen.. 1 by mouth q week . take with 8 ounces of water on and empty stomach 12)  Aggrenox 25-200 Mg Xr12h-cap (Aspirin-dipyridamole) .... Take one tablet by mouth daily 13)  Vicodin 5-500 Mg Tabs (Hydrocodone-acetaminophen) .Marland Kitchen.. 1-2 tabs by mouth three times a day with food as needed for back pain  Patient Instructions: 1)  Treat Sacroiliac pain with Vicodin as needed. 2)  Use icy hot as needed. 3)  Avoid prolonged sitting. 4)  Call PCP if not improving in a wk.   Prescriptions: VICODIN 5-500 MG TABS (HYDROCODONE-ACETAMINOPHEN) 1-2 tabs by mouth three times a day with food as needed for back pain  #30 x 0   Entered and Authorized by:   Seymour Bars DO   Signed by:   Seymour Bars DO on 06/22/2010   Method used:   Printed then faxed to ...       [facility]  [address] [facility])       [address]       [place], [place]  [address]       Ph: [phone] or [phone]       Fax: [phone]   RxID:   [id number]

## 2010-11-05 NOTE — Progress Notes (Signed)
Summary: wants one month supply of trazadone  Phone Note Refill Request Message from:  Patient  Refills Requested: Medication #1:  TRAZODONE HCL 100 MG TABS 200 mg.  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  Initial call taken by: Lowella Petties CMA,  January 03, 2010 3:07 PM    Prescriptions: TRAZODONE HCL 100 MG TABS (TRAZODONE HCL) 200 mg.  once daily  #60 x 0   Entered and Authorized by:   Ruthe Mannan MD   Signed by:   Ruthe Mannan MD on 01/03/2010   Method used:   [facility]        [facility] [facility]       [address]       [place], [place]  [address]       Ph: [phone] or [phone]       Fax: [phone]   RxID:   [id number]   Appended Document: wants one month supply of trazadone Medication phoned to pharmacy. Patient Advised.

## 2010-11-05 NOTE — Assessment & Plan Note (Signed)
Summary: PAP SMEAR AND CPX,F/U CXR/CLE   Vital Signs:  Patient profile:   65 year old female Height:      64.25 inches Weight:      186.38 pounds BMI:     31.86 Temp:     98.6 degrees F oral Pulse rate:   84 / minute Pulse rhythm:   regular BP sitting:   144 / 86  (left arm) Cuff size:   large  Vitals Entered By: Delilah Shan CMA Duncan Dull) (December 12, 2009 2:01 PM) CC: CPX - Pap.  Follow up CXR   History of Present Illness: 65 yo pleasant female here for pap and follow up CXR.  1.  Recent bronchitis/lung shadow on CXR- went to urgent care on 10/28/09 for cough, congestion.  Given Zpack, congestion and cough persisted, given Avelox.  Feels much better.  CXR showed possible RLL lung shadow.  No current cough, shortness of breath, CP, or sputum production.  She is a non smoker.  Follow up CXR this week showed no acute findings, she did have calcifications in her breast tissue.  Mammogram showed some breast asymmetry requiring further imaging, she is going for those this Friday.  2.  erythema nodosum- after URI, noticed LE painful rash,  Started with Marone painful area behind lower left leg.  No visible rash but hurt to touch, even sheets on bed hurt.   A couple days later, noticed a red, raised lump in the that area, then developped several more scattered on left leg, a couple on right.  Not better but about the same.  .  CBC, VIt D, TSH, BMET all within normal lmits.  Rash has improved, no longer  as painful.  3.  Lipid panel showed low HDL, otherwise normal.  Has strong family history.  Taking Lovaza 1 gm daily, Simvastatin 40 mg and Niaspan 500 mg daily.    4.  Muscle aches- has h/o fibromyalgia.  Has been having muscle aches in her thighs and shoulders that she never had before over the last several weeks.  No proximal muscle weakness that she is aware of.  Has been on statin for years, never had this issue before.  Mobic not helping.  4.  Well woman- Needs pap, mammogram, Zostavax.  UTD  colonoscopy, pneumovax, FLP, VIt D. Never had a h/o abnormal pap smears.  No issues with painful intercourse, vaginal dryness or irritation.  Current Medications (verified): 1)  Omeprazole 40 Mg Cpdr (Omeprazole) .... Take 1 Tablet By Mouth Two Times A Day 2)  Trazodone Hcl 100 Mg Tabs (Trazodone Hcl) .... 200 Mg.  Once Daily 3)  Lamotrigine 200 Mg Tabs (Lamotrigine) .... 225 Mg. Once Daily 4)  Biotin 1000 Mcg Tabs (Biotin) .... Take 1 Tablet By Mouth Once A Day 5)  Vitamin D 1000 Unit  Tabs (Cholecalciferol) .... Take 1 Tablet By Mouth Two Times A Day 6)  Lovaza 1 Gm Caps (Omega-3-Acid Ethyl Esters) .... 2 Grams . Two Times A Day 7)  Calcium Carbonate-Vitamin D 600-400 Mg-Unit  Tabs (Calcium Carbonate-Vitamin D) .... Take 1 Tablet By Mouth Two Times A Day 8)  Simvastatin 40 Mg Tabs (Simvastatin) .... Take 1 Tablet By Mouth Once A Day 9)  Niaspan 500 Mg Cr-Tabs (Niacin (Antihyperlipidemic)) .Marland Kitchen.. 100 Mg. Once Daily 10)  Mobic 15 Mg Tabs (Meloxicam) .Marland Kitchen.. 1 Tab By Mouth Daily As Needed Pain. 11)  Lexapro 10 Mg Tabs (Escitalopram Oxalate) .... Take 1 Tablet By Mouth Once A Day  Allergies (verified):  No Known Drug Allergies  Review of Systems      See HPI General:  Denies chills and fever. Eyes:  Denies blurring. ENT:  Denies difficulty swallowing. CV:  Denies chest pain or discomfort. GI:  Denies abdominal pain. MS:  Complains of muscle aches; denies joint pain, joint redness, joint swelling, loss of strength, muscle, cramps, and muscle weakness. Psych:  Denies anxiety and depression. Heme:  Denies abnormal bruising and bleeding.  Physical Exam  General:  alert, pleasant, NAD. Mouth:  MMM Neck:  No deformities, masses, or tenderness noted. Breasts:  No mass, nodules, thickening, tenderness, bulging, retraction, inflamation, nipple discharge or skin changes noted, has breast implants.   old scars under breast bilaterally. Lungs:  Normal respiratory effort, chest expands symmetrically.  Lungs are clear to auscultation, no crackles or wheezes. Heart:  Normal rate and regular rhythm. S1 and S2 normal without gallop, murmur, click, rub or other extra sounds. Genitalia:  Pelvic Exam:        External: normal female genitalia without lesions or masses        Vagina: normal without lesions or masses        Cervix: normal without lesions or masses        Adnexa: normal bimanual exam without masses or fullness        Uterus: normal by palpation        Pap smear: performed Extremities:  no edema, firm, erythematous nodules on left lower leg, some painful to touch, not warm, echymosis on right upper thigh, improved. Psych:  alert, very pleasant, good eye contact.   Impression & Recommendations:  Problem # 1:  SCREENING FOR MALIGNANT NEOPLASM OF THE CERVIX (ICD-V76.2) Assessment New Pap today. Orders: Pap Smear, Thin Prep ( Collection of) [id number])  Problem # 2:  PAIN IN SOFT TISSUES OF LIMB (ICD-729.5) Assessment: New May be related to fibromyalgia.  Given handout about supplements which can help with fibromyalgia pain.  I do want to check CK, ESR, ANA to rule out other inflammatory disorders or myositis (?statin).  PMR is also a possibility. Orders: Venipuncture ([address]) TLB-CK Total Only(Creatine Kinase/CPK) (82550-CK) TLB-Sedimentation Rate (ESR) (85652-ESR) T-Antinuclear Antib (ANA) (09811-91478) Pap Smear, Thin Prep ( Collection of) (G9562)  Problem # 3:  HYPERLIPIDEMIA (ICD-272.4) Assessment: Unchanged If HDL remains low at follow up visit, will consider ordering NMR lipoprofile although she is already on statin, Lovaza and Niaspan.  Do not want to add fibrate due to worry for mylagias but perhaps Ezetimide. Her updated medication list for this problem includes:    Lovaza 1 Gm Caps (Omega-3-acid ethyl esters) .Marland Kitchen... 2 grams . two times a day    Simvastatin 40 Mg Tabs (Simvastatin) .Marland Kitchen... Take 1 tablet by mouth once a day    Niaspan 500 Mg Cr-tabs (Niacin  (antihyperlipidemic)) .Marland KitchenMarland KitchenMarland KitchenMarland Kitchen 100 mg. once daily  Complete Medication List: 1)  Omeprazole 40 Mg Cpdr (Omeprazole) .... Take 1 tablet by mouth two times a day 2)  Trazodone Hcl 100 Mg Tabs (Trazodone hcl) .... 200 mg.  once daily 3)  Lamotrigine 200 Mg Tabs (Lamotrigine) .... 225 mg. once daily 4)  Biotin 1000 Mcg Tabs (Biotin) .... Take 1 tablet by mouth once a day 5)  Vitamin D 1000 Unit Tabs (Cholecalciferol) .... Take 1 tablet by mouth two times a day 6)  Lovaza 1 Gm Caps (Omega-3-acid ethyl esters) .... 2 grams . two times a day 7)  Calcium Carbonate-vitamin D 600-400 Mg-unit Tabs (Calcium carbonate-vitamin d) .... Take 1 tablet by  mouth two times a day 8)  Simvastatin 40 Mg Tabs (Simvastatin) .... Take 1 tablet by mouth once a day 9)  Niaspan 500 Mg Cr-tabs (Niacin (antihyperlipidemic)) .Marland Kitchen.. 100 mg. once daily 10)  Mobic 15 Mg Tabs (Meloxicam) .Marland Kitchen.. 1 tab by mouth daily as needed pain. 11)  Lexapro 10 Mg Tabs (Escitalopram oxalate) .... Take 1 tablet by mouth once a day Prescriptions: TRAZODONE HCL 100 MG TABS (TRAZODONE HCL) 200 mg.  once daily  #30 x 0   Entered and Authorized by:   Ruthe Mannan MD   Signed by:   Ruthe Mannan MD on 12/12/2009   Method used:   [facility]        [facility]  [address] [facility]       [address]       [place], [place]  [address]       Ph: [phone] or [phone]       Fax: [phone]   RxID:   [id number] OMEPRAZOLE 40 MG CPDR (OMEPRAZOLE) Take 1 tablet by mouth two times a day  #90 x 3   Entered and Authorized by:   Ruthe Mannan MD   Signed by:   Ruthe Mannan MD on 12/12/2009   Method used:   Faxed to ...       [facility] (mail-order)             , [place]         Ph: [phone]       Fax: [phone]   RxID:   [id number] TRAZODONE HCL 100 MG TABS (TRAZODONE HCL) 200 mg.  once daily  #90 x 3   Entered and Authorized by:   Ruthe Mannan MD   Signed by:   Ruthe Mannan MD on 12/12/2009   Method used:   Faxed to ...       [facility]  (mail-order)             , [place]         Ph: [phone]       Fax: [phone]   RxID:   [id number] LOVAZA 1 GM CAPS (OMEGA-3-ACID ETHYL ESTERS) 2 grams . two times a day  #90 x 3   Entered and Authorized by:   Ruthe Mannan MD   Signed by:   Ruthe Mannan MD on 12/12/2009   Method used:   Faxed to ...       [facility] (mail-order)             , [place]         Ph: [phone]       Fax: [phone]   RxID:   [id number] SIMVASTATIN 40 MG TABS (SIMVASTATIN) Take 1 tablet by mouth once a day  #90 x 3   Entered and Authorized by:   Ruthe Mannan MD   Signed by:   Ruthe Mannan MD on 12/12/2009   Method used:   Faxed to ...       [facility] ([facility]order)             , [place]         Ph: [phone]       Fax: [phone]   RxID:   [id number] LEXAPRO 10 MG TABS (ESCITALOPRAM OXALATE) Take 1 tablet by mouth once a day  #90 x 3   Entered and Authorized by:   Ruthe Mannan MD   Signed by:   Ruthe Mannan MD on 12/12/2009   Method used:   F[facility]ed to [facility]  [address] [facility] (mail-order)             , [place]         Ph: [phone]       Fax: [phone]   RxID:   [id number]   Current Allergies (reviewed today): No known allergies

## 2010-11-05 NOTE — Progress Notes (Signed)
Summary: Needs written scripts for mail order  Phone Note Refill Request Call back at [phone] Message from:  Patient  Refills Requested: Medication #1:  OMEPRAZOLE 40 MG CPDR Take 1 tablet by mouth two times a day  Medication #2:  TRAZODONE HCL 100 MG TABS 200 mg.  once daily  Medication #3:  LOVAZA 1 GM CAPS 2 grams . two times a day  Medication #4:  SIMVASTATIN 40 MG TABS Take 1 tablet by mouth once a day Also lexapro.  Pt needs written 90 day scripts to send to [facility].  These were sent electronically at her physical appt on 3/9 but apparently they were never received.  Please call pt when ready and she will pick up. Also she wants a 30 day supply of trazadone sent to [facility].  Her chart says she got 30 on 12/12/09, to take one a day, but the pt says she takes 2 a day and that is what the bottle says from the pharmacy.  Initial call taken by: Lowella Petties CMA,  December 24, 2009 10:59 AM  Follow-up for Phone Call        okay to refill except for trazodone...please print out under my name and I will sign. [facility] pharmacy to find out if filled.[facility] Kitchenif they never recieved/picked up  may fill #30 for her 0 RF.  Follow-up by: Kerby Nora MD,  December 24, 2009 11:30 AM  Additional Follow-up for Phone Call Additional follow up Details #1::        Patient did pick up trazodone but, it was for 1 a night and she take 2  Additional Follow-up by: Benny Lennert CMA Duncan Dull),  December 24, 2009 11:38 AM    Prescriptions: TRAZODONE HCL 100 MG TABS (TRAZODONE HCL) 200 mg.  once daily  #60 x 0   Entered and Authorized by:   Kerby Nora MD   Signed by:   Kerby Nora MD on 12/25/2009   Method used:   Print then Give to Patient   RxID:   [id number] LEXAPRO 10 MG TABS (ESCITALOPRAM OXALATE) Take 1 tablet by mouth once a day  #90 x 3   Entered by:   Benny Lennert CMA (AAMA)   Authorized by:   Kerby Nora MD   Signed by:   Benny Lennert CMA (AAMA) on  12/24/2009   Method used:   Print then Give to Patient   RxID:   [id number] SIMVASTATIN 40 MG TABS (SIMVASTATIN) Take 1 tablet by mouth once a day  #90 x 3   Entered by:   Benny Lennert CMA (AAMA)   Authorized by:   Kerby Nora MD   Signed by:   Benny Lennert CMA (AAMA) on 12/24/2009   Method used:   Print then Give to Patient   RxID:   [id number] LOVAZA 1 GM CAPS (OMEGA-3-ACID ETHYL ESTERS) 2 grams . two times a day  #90 x 3   Entered by:   Benny Lennert CMA (AAMA)   Authorized by:   Kerby Nora MD   Signed by:   Benny Lennert CMA (AAMA) on 12/24/2009   Method used:   Print then Give to Patient   RxID:   [id number] OMEPRAZOLE 40 MG CPDR (OMEPRAZOLE) Take 1 tablet by mouth two times a day  #90 x 3   Entered by:   Benny Lennert CMA (AAMA)   Authorized by:   Kerby Nora MD   Signed by:   Herbert Seta  Woodard CMA (AAMA) on 12/24/2009   Method used:   Print then Give to Patient   RxID:   [id number]   Prior Medications: OMEPRAZOLE 40 MG CPDR (OMEPRAZOLE) Take 1 tablet by mouth two times a day TRAZODONE HCL 100 MG TABS (TRAZODONE HCL) 200 mg.  once daily LAMOTRIGINE 200 MG TABS (LAMOTRIGINE) 225 mg. once daily BIOTIN 1000 MCG TABS (BIOTIN) Take 1 tablet by mouth once a day VITAMIN D 1000 UNIT  TABS (CHOLECALCIFEROL) Take 1 tablet by mouth two times a day LOVAZA 1 GM CAPS (OMEGA-3-ACID ETHYL ESTERS) 2 grams . two times a day CALCIUM CARBONATE-VITAMIN D 600-400 MG-UNIT  TABS (CALCIUM CARBONATE-VITAMIN D) Take 1 tablet by mouth two times a day SIMVASTATIN 40 MG TABS (SIMVASTATIN) Take 1 tablet by mouth once a day NIASPAN 500 MG CR-TABS (NIACIN (ANTIHYPERLIPIDEMIC)) 100 mg. once daily MOBIC 15 MG TABS (MELOXICAM) 1 tab by mouth daily as needed pain. LEXAPRO 10 MG TABS (ESCITALOPRAM OXALATE) Take 1 tablet by mouth once a day Current Allergies: No known allergies

## 2010-11-05 NOTE — Assessment & Plan Note (Signed)
Summary: F/U on bone density test/lsf   Vital Signs:  Patient profile:   65 year old female Height:      64.25 inches Weight:      185.50 pounds BMI:     31.71 Temp:     98.4 degrees F oral Pulse rate:   80 / minute Pulse rhythm:   regular BP sitting:   102 / 68  (left arm) Cuff size:   large  Vitals Entered By: Delilah Shan CMA Duncan Dull) (January 30, 2010 12:30 PM) CC: F/ U on bone density test   History of Present Illness: 65 yo female here to discuss results of bone density test.  Left femur -1.9, consistent with osteopenia with mod risk for fracture.   Lumbar spine actually looked quite good.  Takes adequate Ca and Vit D daily(see med list). Also loves milk and gets daily weight bearing activity.  No fractures.  She is concerned because her bone density is deteriorating despite her best efforts.  Current Medications (verified): 1)  Omeprazole 40 Mg Cpdr (Omeprazole) .... Take 1 Tablet By Mouth Two Times A Day 2)  Trazodone Hcl 100 Mg Tabs (Trazodone Hcl) .... 200 Mg.  Once Daily 3)  Lamotrigine 200 Mg Tabs (Lamotrigine) .... 225 Mg. Once Daily 4)  Vitamin D 1000 Unit  Tabs (Cholecalciferol) .... Take 1 Tablet By Mouth in Am and Two Tabs By Mouth in Pm 5)  Lovaza 1 Gm Caps (Omega-3-Acid Ethyl Esters) .... 2 Grams . Two Times A Day 6)  Calcium Carbonate-Vitamin D 600-400 Mg-Unit  Tabs (Calcium Carbonate-Vitamin D) .... Take 1 Tablet By Mouth Two Times A Day 7)  Simvastatin 40 Mg Tabs (Simvastatin) .... Take 1 Tablet By Mouth Once A Day 8)  Niaspan 500 Mg Cr-Tabs (Niacin (Antihyperlipidemic)) .Marland Kitchen.. 1000 Mg. Once Daily 9)  Mobic 15 Mg Tabs (Meloxicam) .Marland Kitchen.. 1 Tab By Mouth Daily As Needed Pain. 10)  Lexapro 10 Mg Tabs (Escitalopram Oxalate) .... Take 1 Tablet By Mouth Once A Day 11)  Gabapentin 300 Mg  Caps (Gabapentin) .Marland Kitchen.. 1 Tab By Mouth Qhs 12)  Advil 200 Mg Tabs (Ibuprofen) .... Otc As Directed. 13)  Boniva 150 Mg Tabs (Ibandronate Sodium) .Marland Kitchen.. 1 By Mouth Monthly, Take On An  Empty Stomach With A Full Glass of Water. Do Not Eat Anything or Lie Down For 1 Hour  Allergies (verified): No Known Drug Allergies  Review of Systems      See HPI  Physical Exam  General:  alert, pleasant, NAD. Psych:  alert, very pleasant, good eye contact.   Impression & Recommendations:  Problem # 1:  OSTEOPENIA (ICD-733.90) Assessment New Time spent with patient 25 minutes, more than 50% of this time was spent counseling patient on osteopenia, causes and treatment. Discussed that taking long term PPIs like she is can affect bone density.  We agreed that she needs Omeprazole however she cannot take it at the same time as a bisphosphonate.  Discussed different options for osteopenia.  We will try Boniva 150 mg monthly.  Continue Ca and Vit D. Repeat DEXA in 1-2 years. Pt info handout given and side effects and proper administration discussed.  Her updated medication list for this problem includes:    Vitamin D 1000 Unit Tabs (Cholecalciferol) .Marland Kitchen... Take 1 tablet by mouth in am and two tabs by mouth in pm    Calcium Carbonate-vitamin D 600-400 Mg-unit Tabs (Calcium carbonate-vitamin d) .Marland Kitchen... Take 1 tablet by mouth two times a day    Boniva  150 Mg Tabs (Ibandronate sodium) .Marland Kitchen... 1 by mouth monthly, take on an empty stomach with a full glass of water. do not eat anything or lie down for 1 hour  Complete Medication List: 1)  Omeprazole 40 Mg Cpdr (Omeprazole) .... Take 1 tablet by mouth two times a day 2)  Trazodone Hcl 100 Mg Tabs (Trazodone hcl) .... 200 mg.  once daily 3)  Lamotrigine 200 Mg Tabs (Lamotrigine) .... 225 mg. once daily 4)  Vitamin D 1000 Unit Tabs (Cholecalciferol) .... Take 1 tablet by mouth in am and two tabs by mouth in pm 5)  Lovaza 1 Gm Caps (Omega-3-acid ethyl esters) .... 2 grams . two times a day 6)  Calcium Carbonate-vitamin D 600-400 Mg-unit Tabs (Calcium carbonate-vitamin d) .... Take 1 tablet by mouth two times a day 7)  Simvastatin 40 Mg Tabs  (Simvastatin) .... Take 1 tablet by mouth once a day 8)  Niaspan 500 Mg Cr-tabs (Niacin (antihyperlipidemic)) .Marland Kitchen.. 1000 mg. once daily 9)  Mobic 15 Mg Tabs (Meloxicam) .Marland Kitchen.. 1 tab by mouth daily as needed pain. 10)  Lexapro 10 Mg Tabs (Escitalopram oxalate) .... Take 1 tablet by mouth once a day 11)  Gabapentin 300 Mg Caps (Gabapentin) .Marland Kitchen.. 1 tab by mouth qhs 12)  Advil 200 Mg Tabs (Ibuprofen) .... Otc as directed. 13)  Boniva 150 Mg Tabs (Ibandronate sodium) .Marland Kitchen.. 1 by mouth monthly, take on an empty stomach with a full glass of water. do not eat anything or lie down for 1 hour  Patient Instructions: 1)  Great to see you, Ms. Sheaffer. 2)  We will call you as soon as we get the Shingles vaccine. Prescriptions: BONIVA 150 MG TABS (IBANDRONATE SODIUM) 1 BY MOUTH MONTHLY, take on an empty stomach with a full glass of water. do not eat anything or lie down for 1 hour  #3 x 3   Entered and Authorized by:   Ruthe Mannan MD   Signed by:   Ruthe Mannan MD on 01/30/2010   Method used:   [facility]        [facility]  [address] [facility]       [address]       [place], [place]  [address]       Ph: [phone] or [phone]       Fax: [phone]   RxID:   [id number]   Current Allergies (reviewed today): No known allergies

## 2010-11-05 NOTE — Medication Information (Signed)
Summary: [facility] / BONIVA 150 MG TABLET  [facility] / BONIVA 150 MG TABLET   Imported By: Carin Primrose 01/31/2010 09:23:31  _____________________________________________________________________  External Attachment:    Type:   Image     Comment:   External Document

## 2010-11-05 NOTE — Progress Notes (Signed)
Summary: Prior Authorization Boniva  Phone Note From Pharmacy Call back at ph [phone] fax [phone]   [facility]  [address] Call For: Dr. Dayton Martes  Action Taken: Prescription resent Summary of Call: Received form from pharmacy stating that PA is needed for Boniva 150mg .  Called [facility] at [phone] and they will fax forms today.  Linde Gillis CMA Duncan Dull)  January 30, 2010 4:05 PM   Prior Authorization forms in your IN box.   Initial call taken by: Linde Gillis CMA Duncan Dull),  January 30, 2010 4:37 PM  Follow-up for Phone Call        In my box. Ruthe Mannan MD  January 31, 2010 7:49 AM   Additional Follow-up for Phone Call Additional follow up Details #1::        PA was denied because patient has not tried Alendronate.  Please advise  Additional Follow-up by: Linde Gillis CMA Duncan Dull),  January 31, 2010 10:49 AM    Additional Follow-up for Phone Call Additional follow up Details #2::    Pt has significant acid reflux so we need a monthly preparation, not weekly.  Is there another form I can fill out? Ruthe Mannan MD  January 31, 2010 10:50 AM  There isn't another form that you can fill out but what I have notified in the past with PA's is that if the provider writes a letter stating by the patient needs a certain medication they usually end up approving the medication.  Letter must include patient's name, DOB, medications tried/failed, diagnosis, and any other clinical information you think would be helpful.  Let me know if I can assist.  Thanks.  Linde Gillis CMA Duncan Dull)  January 31, 2010 10:58 AM

## 2010-11-07 NOTE — Assessment & Plan Note (Signed)
Summary: 2nd TWINRIX, Tdap, ZOSTAVAX/PER DR Danikah Budzik/NT  Nurse Visit   Allergies: No Known Drug Allergies  Immunizations Administered:  Tetanus Vaccine:    Vaccine Type: Tdap    Site: right deltoid    Mfr: GlaxoSmithKline    Dose: 0.5 ml    Route: IM    Given by: Sydell Axon LPN    Exp. Date: 07/25/2012    Lot #: [id number]    VIS given: 08/23/08 version given October 15, 2010.  Zostavax # 1:    Vaccine Type: Zostavax    Site: left deltoid    Mfr: Merck    Dose: 0.5 ml    Route: Norphlet    Given by: Sydell Axon LPN    Exp. Date: 05/24/2011    Lot #: [id number]    VIS given: 07/18/05 given October 15, 2010.  TwinRix # 2:    Vaccine Type: TwinRix    Site: left deltoid    Mfr: GlaxoSmithKline    Dose: 1.0 ml    Route: IM    Given by: Sydell Axon LPN    Exp. Date: 08/08/2012    Lot #: [id number]    VIS given: 06/24/07 version given October 15, 2010.  Orders Added: 1)  Tdap => 4yrs IM [90715] 2)  Admin 1st Vaccine [90471] 3)  Zoster (Shingles) Vaccine Live [90736] 4)  Admin of Any Addtl Vaccine [90472] 5)  TwinRix 1ml ( Hep A&B Adult dose) [84696]

## 2010-11-07 NOTE — Progress Notes (Signed)
Summary: wants written script for aggrenox  Phone Note Refill Request Message from:  Patient  Refills Requested: Medication #1:  AGGRENOX 25-200 MG XR12H-CAP take one tablet by mouth daily Pt is asking for a written 90 day script that she can send to mail order.  This was refilled at [facility] yesterday but she says she cant do that anymore because it was too expensive.  She would like the script mailed to her, she lives an hour away.  ( Be careful with her adress, looks like [address] in the chart but is actually [address]).  Initial call taken by: Lowella Petties CMA, AAMA,  September 17, 2010 9:26 AM  Follow-up for Phone Call        Rx mailed to patients home address. Follow-up by: Linde Gillis CMA Duncan Dull),  September 17, 2010 9:51 AM    Prescriptions: AGGRENOX 25-200 MG XR12H-CAP (ASPIRIN-DIPYRIDAMOLE) take one tablet by mouth daily  #90 x 3   Entered and Authorized by:   Ruthe Mannan MD   Signed by:   Ruthe Mannan MD on 09/17/2010   Method used:   Print then Give to Patient   RxID:   [id number]

## 2010-11-07 NOTE — Progress Notes (Signed)
Summary: Rx Aggrenox  Phone Note Call from Patient Call back at Home Phone [phone]   Caller: Patient Call For: Nicole Mannan MD Summary of Call: Patient is changing pharmacies and needs a new rx sent to [facility], [place] for her Aggrenox. Patient is completely out of her medication. [facility] phone #[phone] Initial call taken by: Sydell Axon LPN,  September 16, 2010 4:04 PM  Follow-up for Phone Call        please make sure it is called into the right pharmacy.  The number we have in centricity does not match up number she gave you. Pleas call into number she gave you. Nicole Mannan MD  September 16, 2010 4:10 PM   Additional Follow-up for Phone Call Additional follow up Details #1::        Called telephone number given by patient and was informed by Neysa Bonito that this is the correct pharmacy and that patient had already contacted them about this new script  Address is [address] [place], [place] [address], store number is 374 per [place]. Rx given to [place]. Patient notified that rx has been called to pharmacy. Additional Follow-up by: Sydell Axon LPN,  September 16, 2010 4:27 PM    Prescriptions: AGGRENOX 25-200 MG XR12H-CAP (ASPIRIN-DIPYRIDAMOLE) take one tablet by mouth daily  #60 x 3   Entered and Authorized by:   Nicole Mannan MD   Signed by:   Nicole Mannan MD on 09/16/2010   Method used:   Telephoned to ...       [id number] (retail)       [address]       [place], [place]  [address]       Ph: [phone]       Fax: [phone]   RxID:   [id number]

## 2010-11-07 NOTE — Progress Notes (Signed)
Summary: wants written script for niaspan  Phone Note Refill Request Call back at [phone] Message from:  Patient  Refills Requested: Medication #1:  NIASPAN 500 MG CR-TABS 1000 mg. once daily Pt is asking for a written script to mail to her mail order pharmacy.  She wants to pick this up on friday.  Initial call taken by: Lowella Petties CMA, AAMA,  September 26, 2010 11:41 AM  Follow-up for Phone Call        printed in put in nurse in box for pickup please make sure I did it correctly -- looks like she gets the 500s but takes 1000 per day so I dosed 2 once daily Follow-up by: Judith Part MD,  September 26, 2010 12:27 PM  Additional Follow-up for Phone Call Additional follow up Details #1::        Left message for patient to call back to verify dosage and instruction of med. Prescription left at [facility]. Lewanda Rife LPN  September 26, 2010 3:13 PM   Patient notified as instructed by telephone. Rx done correctly.Prescription left at front desk for pick up.  Lewanda Rife LPN  September 27, 2010 9:23 AM     New/Updated Medications: NIASPAN 500 MG CR-TABS (NIACIN (ANTIHYPERLIPIDEMIC)) 2 by mouth once daily Prescriptions: NIASPAN 500 MG CR-TABS (NIACIN (ANTIHYPERLIPIDEMIC)) 2 by mouth once daily  #180 x 3   Entered and Authorized by:   Judith Part MD   Signed by:   Judith Part MD on 09/26/2010   Method used:   Print then Give to Patient   RxID:   [id number]

## 2010-11-07 NOTE — Progress Notes (Signed)
Summary: regarding vaccines  Phone Note Call from Patient Call back at Home Phone [phone]   Caller: Patient Call For: Ruthe Mannan MD Summary of Call: Pt is coming in tomorrow for hep vaccines and she is asking if she can get zostavax as well.  She also needs pertussis, but got plain Td in december.  Also, she is asking for a script for zofran 4 mg dissolvable, as many as possible, to give to children in [place].  She also needs a 90 day written script for omeprazole to send to mail order pharmacy, she will pick that up.      Lowella Petties CMA, AAMA  October 14, 2010 10:39 AM   Follow-up for Phone Call        yes, she can  have Zostavax. prescriptions are in my box. Ruthe Mannan MD  October 14, 2010 10:49 AM  Should pt get tdap, since she needs the pertussis?  Has recently had Td.    Follow-up by: Lowella Petties CMA, AAMA,  October 14, 2010 12:31 PM  Additional Follow-up for Phone Call Additional follow up Details #1::        yes. Ruthe Mannan MD  October 14, 2010 12:33 PM  Patient advised as instructed via message left on machine at home number.  Additional Follow-up by: Linde Gillis CMA (AAMA),  October 14, 2010 2:01 PM    New/Updated Medications: ZOFRAN ODT 4 MG TBDP (ONDANSETRON) 1-2 tab by mouth two times a day as needed nausea Prescriptions: ZOFRAN ODT 4 MG TBDP (ONDANSETRON) 1-2 tab by mouth two times a day as needed nausea  #60 x 0   Entered and Authorized by:   Ruthe Mannan MD   Signed by:   Ruthe Mannan MD on 10/14/2010   Method used:   Print then Give to Patient   RxID:   [id number] OMEPRAZOLE 40 MG CPDR (OMEPRAZOLE) Take 1 tablet by mouth two times a day  #180 x 3   Entered and Authorized by:   Ruthe Mannan MD   Signed by:   Ruthe Mannan MD on 10/14/2010   Method used:   Print then Give to Patient   RxID:   [id number]   Appended Document: regarding vaccines Rx's left at front desk for pick up.

## 2010-11-07 NOTE — Consult Note (Signed)
Summary: [facility]  [facility]   Imported By: Lanelle Bal 10/03/2010 08:17:46  _____________________________________________________________________  External Attachment:    Type:   Image     Comment:   External Document

## 2010-12-19 ENCOUNTER — Other Ambulatory Visit: Payer: Self-pay | Admitting: Family Medicine

## 2010-12-19 DIAGNOSIS — R918 Other nonspecific abnormal finding of lung field: Secondary | ICD-10-CM

## 2010-12-20 LAB — BASIC METABOLIC PANEL
BUN: 10 mg/dL (ref 6–23)
BUN: 7 mg/dL (ref 6–23)
CO2: 26 mEq/L (ref 19–32)
Calcium: 8.6 mg/dL (ref 8.4–10.5)
Calcium: 8.9 mg/dL (ref 8.4–10.5)
Chloride: 106 mEq/L (ref 96–112)
Chloride: 107 mEq/L (ref 96–112)
Creatinine, Ser: 0.98 mg/dL (ref 0.4–1.2)
Creatinine, Ser: 1.07 mg/dL (ref 0.4–1.2)
GFR calc Af Amer: >60 mL/min (ref >60)
Glucose, Bld: 121 mg/dL — ABNORMAL HIGH (ref 70–99)
Sodium: 143 mEq/L (ref 135–145)

## 2010-12-20 LAB — CBC
Hemoglobin: 14.5 g/dL (ref 12.0–15.0)
Platelets: 168 10*3/uL (ref 150–400)
RBC: 4.8 MIL/uL (ref 3.87–5.11)
WBC: 5.4 10*3/uL (ref 4.0–10.5)

## 2010-12-20 LAB — CK TOTAL AND CKMB (NOT AT ARMC): Relative Index: 1.9 (ref 0.0–2.5)

## 2010-12-20 LAB — COMPREHENSIVE METABOLIC PANEL
AST: 26 U/L (ref 0–37)
Albumin: 3.4 g/dL — ABNORMAL LOW (ref 3.5–5.2)
Alkaline Phosphatase: 41 U/L (ref 39–117)
CO2: 23 mEq/L (ref 19–32)
Glucose, Bld: 105 mg/dL — ABNORMAL HIGH (ref 70–99)
Sodium: 141 mEq/L (ref 135–145)
Total Bilirubin: 0.8 mg/dL (ref 0.3–1.2)
Total Protein: 6.4 g/dL (ref 6.0–8.3)

## 2010-12-20 LAB — TROPONIN I: Troponin I: 0.01 ng/mL (ref 0.00–0.06)

## 2010-12-20 LAB — HEMOGLOBIN A1C: Mean Plasma Glucose: 117 mg/dL — ABNORMAL HIGH (ref <117)

## 2010-12-20 LAB — DIFFERENTIAL
Basophils Absolute: 0 10*3/uL (ref 0.0–0.1)
Basophils Relative: 1 % (ref 0–1)
Eosinophils Relative: 2 % (ref 0–5)
Lymphocytes Relative: 29 % (ref 12–46)
Monocytes Relative: 8 % (ref 3–12)
Neutrophils Relative %: 60 % (ref 43–77)

## 2010-12-20 LAB — LIPID PANEL
LDL Cholesterol: 72 mg/dL (ref 0–99)
Total CHOL/HDL Ratio: 3.8 RATIO
VLDL: 17 mg/dL (ref 0–40)

## 2010-12-20 LAB — PROTIME-INR: INR: 1.09 (ref 0.00–1.49)

## 2010-12-20 LAB — TSH: TSH: 2.7 u[IU]/mL (ref 0.350–4.500)

## 2010-12-20 LAB — POCT CARDIAC MARKERS: Troponin i, poc: <0.05 ng/mL (ref 0.00–0.09)

## 2010-12-21 ENCOUNTER — Encounter: Payer: Self-pay | Admitting: Family Medicine

## 2010-12-21 LAB — HM SIGMOIDOSCOPY

## 2010-12-21 LAB — HM PAP SMEAR

## 2010-12-21 LAB — HM MAMMOGRAPHY

## 2010-12-23 ENCOUNTER — Other Ambulatory Visit: Payer: Self-pay | Admitting: Family Medicine

## 2010-12-23 DIAGNOSIS — Z1231 Encounter for screening mammogram for malignant neoplasm of breast: Secondary | ICD-10-CM

## 2011-01-14 ENCOUNTER — Ambulatory Visit (INDEPENDENT_AMBULATORY_CARE_PROVIDER_SITE_OTHER): Payer: Managed Care, Other (non HMO) | Admitting: Family Medicine

## 2011-01-14 ENCOUNTER — Encounter: Payer: Self-pay | Admitting: Family Medicine

## 2011-01-14 ENCOUNTER — Ambulatory Visit (INDEPENDENT_AMBULATORY_CARE_PROVIDER_SITE_OTHER)
Admission: RE | Admit: 2011-01-14 | Discharge: 2011-01-14 | Disposition: A | Discharge status: Home / Self Care | Payer: Managed Care, Other (non HMO) | Source: Ambulatory Visit | Attending: Family Medicine | Admitting: Family Medicine

## 2011-01-14 ENCOUNTER — Ambulatory Visit
Admission: RE | Admit: 2011-01-14 | Discharge: 2011-01-14 | Disposition: A | Discharge status: Home / Self Care | Payer: Managed Care, Other (non HMO) | Source: Ambulatory Visit | Attending: Family Medicine | Admitting: Family Medicine

## 2011-01-14 DIAGNOSIS — M79609 Pain in unspecified limb: Secondary | ICD-10-CM

## 2011-01-14 DIAGNOSIS — M899 Disorder of bone, unspecified: Secondary | ICD-10-CM

## 2011-01-14 DIAGNOSIS — E785 Hyperlipidemia, unspecified: Secondary | ICD-10-CM

## 2011-01-14 DIAGNOSIS — M949 Disorder of cartilage, unspecified: Secondary | ICD-10-CM

## 2011-01-14 DIAGNOSIS — Z1231 Encounter for screening mammogram for malignant neoplasm of breast: Secondary | ICD-10-CM

## 2011-01-14 DIAGNOSIS — F909 Attention-deficit hyperactivity disorder, unspecified type: Secondary | ICD-10-CM | POA: Insufficient documentation

## 2011-01-14 DIAGNOSIS — M79671 Pain in right foot: Secondary | ICD-10-CM | POA: Insufficient documentation

## 2011-01-14 LAB — BASIC METABOLIC PANEL
CO2: 30 mEq/L (ref 19–32)
Calcium: 9.3 mg/dL (ref 8.4–10.5)
Chloride: 103 mEq/L (ref 96–112)
Creatinine, Ser: 1 mg/dL (ref 0.4–1.2)
Glucose, Bld: 87 mg/dL (ref 70–99)

## 2011-01-14 LAB — LDL CHOLESTEROL, DIRECT: Direct LDL: 93.7 mg/dL

## 2011-01-14 MED ORDER — OMEPRAZOLE 40 MG PO CPDR: 40.0000 mg | DELAYED_RELEASE_CAPSULE | Freq: Two times a day (BID) | ORAL | Status: DC

## 2011-01-14 MED ORDER — SIMVASTATIN 40 MG PO TABS: 40.0000 mg | ORAL_TABLET | Freq: Every day | ORAL | Status: DC

## 2011-01-14 MED ORDER — METHYLPHENIDATE HCL 10 MG PO TABS: 10.0000 mg | ORAL_TABLET | Freq: Two times a day (BID) | ORAL | Status: DC

## 2011-01-14 MED ORDER — NIACIN ER (ANTIHYPERLIPIDEMIC) 1000 MG PO TBCR: 1000.0000 mg | EXTENDED_RELEASE_TABLET | Freq: Every day | ORAL | Status: DC

## 2011-01-14 MED ORDER — ASPIRIN-DIPYRIDAMOLE ER 25-200 MG PO CP12: 1.0000 | ORAL_CAPSULE | Freq: Every day | ORAL | Status: DC

## 2011-01-14 MED ORDER — LAMOTRIGINE 200 MG PO TABS: 200.0000 mg | ORAL_TABLET | Freq: Every day | ORAL | Status: DC

## 2011-01-14 MED ORDER — GABAPENTIN 300 MG PO CAPS: 300.0000 mg | ORAL_CAPSULE | Freq: Two times a day (BID) | ORAL | Status: DC

## 2011-01-14 MED ORDER — ESCITALOPRAM OXALATE 20 MG PO TABS: 20.0000 mg | ORAL_TABLET | Freq: Every day | ORAL | Status: DC

## 2011-01-14 NOTE — Assessment & Plan Note (Signed)
New.  Likely a sprain but I will repeat xray today to rule out fracture given tenderness to palpation.

## 2011-01-14 NOTE — Assessment & Plan Note (Signed)
Deteriorated. Restart Ritalin.

## 2011-01-14 NOTE — Progress Notes (Signed)
65 yo with known h/o fibromyalgia, h/o TIA here to discuss her medications and right foot pain.  Would like to restart Ritalin.  Was diagnosed with ADHD several years ago, wants to restart it. Having difficulty completing tasks.  Has lost 41 pounds with diet and exercise. Feels great, neuropathy improved. Would like lipids rechecked since she lost so much weight.  Right foot pain- tripped over stairs in movie theatre 2 weeks ago.  Immediately had pain and swelling of right foot.  Xrays in ER were negative but she is still having significant pain. Swelling has improved.  The PMH, PSH, Social History, Family History, Medications, and allergies have been reviewed in [facility], and have been updated if relevant.   Review of Systems       See HPI MS:  Denies loss of strength. Neuro: denies poor balance, tremors, visual disturbances, and weakness.  Physical Exam BP 102/60  Pulse 63  Temp(Src) 98.2 F (36.8 C) (Oral)  Ht 5\' 5"  (1.651 m)  Wt 152 lb 12.8 oz (69.31 kg)  BMI 25.43 kg/m2  General:  alert, pleasant, NAD. Msk:  No deformity or scoliosis noted of thoracic or lumbar spine.   Extremities:  Right foot/ankle- normal stability, mild swelling.  Very TTP over lateral foot.  Neurologic:  alert & oriented X3, cranial nerves II-XII intact, strength normal in all extremities, sensation intact to light touch, sensation intact to pinprick, gait normal, and DTRs symmetrical and normal.   Psych:  alert, very pleasant, good eye contact.

## 2011-01-22 ENCOUNTER — Telehealth: Payer: Self-pay | Admitting: *Deleted

## 2011-01-22 NOTE — Telephone Encounter (Signed)
[facility] asking for [facility] on niaspan script.  Form is on your desk.

## 2011-01-23 NOTE — Telephone Encounter (Signed)
Faxed by Carrie. 

## 2011-01-23 NOTE — Telephone Encounter (Signed)
Form completed.

## 2011-01-24 ENCOUNTER — Other Ambulatory Visit: Payer: Self-pay | Admitting: *Deleted

## 2011-01-24 MED ORDER — ESCITALOPRAM OXALATE 20 MG PO TABS: 20.0000 mg | ORAL_TABLET | Freq: Every day | ORAL | Status: DC

## 2011-01-24 NOTE — Telephone Encounter (Signed)
Pt requested # 10 lexapro be called to [facility] in [place].  Pt is traveling and left her medicine at home.

## 2011-02-11 ENCOUNTER — Other Ambulatory Visit: Payer: Self-pay | Admitting: Family Medicine

## 2011-02-11 DIAGNOSIS — R918 Other nonspecific abnormal finding of lung field: Secondary | ICD-10-CM

## 2011-02-13 ENCOUNTER — Other Ambulatory Visit: Payer: Self-pay

## 2011-02-13 ENCOUNTER — Other Ambulatory Visit: Payer: Self-pay | Admitting: *Deleted

## 2011-02-13 MED ORDER — ALENDRONATE SODIUM 70 MG PO TABS: 70.0000 mg | ORAL_TABLET | ORAL | Status: DC

## 2011-02-13 MED ORDER — TRAZODONE HCL 100 MG PO TABS: 200.0000 mg | ORAL_TABLET | Freq: Every day | ORAL | Status: DC

## 2011-02-13 NOTE — Telephone Encounter (Signed)
[facility] would [facility] and then supply mailed to her at her home address.

## 2011-02-13 NOTE — Telephone Encounter (Signed)
30 day supply sent to pharmacy, 90 day supply mailed to patients home address.  Patient advised.

## 2011-02-18 ENCOUNTER — Other Ambulatory Visit: Payer: Self-pay

## 2011-02-18 ENCOUNTER — Ambulatory Visit (INDEPENDENT_AMBULATORY_CARE_PROVIDER_SITE_OTHER): Payer: Managed Care, Other (non HMO) | Admitting: Family Medicine

## 2011-02-18 ENCOUNTER — Other Ambulatory Visit: Payer: Self-pay | Admitting: Family Medicine

## 2011-02-18 ENCOUNTER — Encounter: Payer: Self-pay | Admitting: Family Medicine

## 2011-02-18 ENCOUNTER — Other Ambulatory Visit (INDEPENDENT_AMBULATORY_CARE_PROVIDER_SITE_OTHER): Payer: Managed Care, Other (non HMO) | Admitting: Family Medicine

## 2011-02-18 VITALS — BP 104/60 | HR 76 | Temp 98.0°F | Wt 153.1 lb

## 2011-02-18 DIAGNOSIS — N644 Mastodynia: Secondary | ICD-10-CM

## 2011-02-18 DIAGNOSIS — R918 Other nonspecific abnormal finding of lung field: Secondary | ICD-10-CM

## 2011-02-18 LAB — CREATININE, SERUM: Creatinine, Ser: 1 mg/dL (ref 0.4–1.2)

## 2011-02-18 MED ORDER — CYCLOBENZAPRINE HCL 5 MG PO TABS: 5.0000 mg | ORAL_TABLET | Freq: Two times a day (BID) | ORAL | Status: DC | PRN

## 2011-02-18 NOTE — Progress Notes (Signed)
  Subjective:    Patient ID: Nicole Perkins, female    DOB: Jan 09, 1946, 65 y.o.   MRN: [id number]  HPI CC: R breast pain  DOI: 02/12/2011.  New to yoga.  Sustained fall, felt "crackling sound" and ever since then having pain R breast.  Worse with certain movements (like turning steering wheel).  Has implants.  Doesn't like to take ibuprofen 2/2 being on aggrenox.  Only taking tylenol 1-2 500mg  tablets once a day.  Pain mainly "inside" breast.  Depaoli pain alternating with ache.  Hasn't tried ice or heat.  Doesn't feel anything pulling.  Actually seems to be worsening instead of improving.  No problems with breasts prior to fall.  mammo this year WNL (01/2011).  Review of Systems Per HPI    Objective:   Physical Exam  Constitutional: She appears well-developed and well-nourished. No distress.  Pulmonary/Chest: Effort normal and breath sounds normal. No respiratory distress. She has no wheezes. She has no rales. She exhibits tenderness and swelling. She exhibits no mass, no bony tenderness, no edema, no deformity and no retraction. Breasts are symmetrical.         No ecchymosis. Main tenderness seems to be with palpation of anterior superior breast at 12 o clock position diffusely, as well as some tenderness at 8 o clock position. Some swelling as well of right breast compared to left breast. Unable to palpate implants.          Assessment & Plan:

## 2011-02-18 NOTE — Assessment & Plan Note (Signed)
Breast pain, anticipate breast contusion causing pain.  Recommend alternate ice/heat. Alternatively could be pulled muscle, treat with flexeril. In setting of implants, and worsening pain instead of improving over time.  Set up with ultrasound R breast to r/o implant damage/rupture. No evidence for ruptured pec maj/min tendons on exam today.

## 2011-02-18 NOTE — Patient Instructions (Signed)
Could be breast bruise or pulled muscle. Pass by Nicole Perkins's office to schedule breast ultrasound to rule out other injury. In meantime alternate heat and ice (whichever soothes breast better). May use flexeril as muscle relaxant in case pulled muscle component. Good to meet you today.  Call us with questions.

## 2011-02-19 ENCOUNTER — Telehealth: Payer: Self-pay | Admitting: *Deleted

## 2011-02-19 DIAGNOSIS — N644 Mastodynia: Secondary | ICD-10-CM

## 2011-02-19 NOTE — Telephone Encounter (Signed)
Patient called and said that she has been doing research and has found that an MRI would be the best imaging study to have to rule out ruptured implant. She said the imaging center is insisting that she have a mammogram. She doesn't think that is what she needs and is afraid that it will be painful. She requests one of [place] call her and let her know what needs to be done.

## 2011-02-19 NOTE — Telephone Encounter (Signed)
Patient called me to voice her concern over the MMG and [place] that the breast ctr said is their protocol. I told her that this is their protocol and they will not do just an [place]. She asked that you would please call her in some pain medicine as she knows this will be very painful. She cant take Nsaids and said that tylenol with codeine didn't help much in past. Please call in what you feel will help [facility] to [facility] [address]. Phone#[phone].

## 2011-02-20 ENCOUNTER — Other Ambulatory Visit: Payer: Self-pay | Admitting: Family Medicine

## 2011-02-20 ENCOUNTER — Other Ambulatory Visit: Payer: Self-pay

## 2011-02-20 NOTE — Telephone Encounter (Signed)
Called patient, MRI is an option for evaluating. Pt does have silicone breast implants. Have placed order in chart for MRI.   Advised breast center and/or insurance may want mammo first, but will check to see if can automatically do breast MRI. If mammo required, I'd like to call in vicodin 5/500 take one po q6 hours prn pain, #10, RF 0.

## 2011-02-20 NOTE — Telephone Encounter (Signed)
Changed MRI to bilateral. Likely no need for vicodin as will obtain MRI.

## 2011-02-21 ENCOUNTER — Ambulatory Visit
Admission: RE | Admit: 2011-02-21 | Discharge: 2011-02-21 | Disposition: A | Discharge status: Home / Self Care | Payer: Managed Care, Other (non HMO) | Source: Ambulatory Visit | Attending: Family Medicine | Admitting: Family Medicine

## 2011-02-21 ENCOUNTER — Telehealth: Payer: Self-pay | Admitting: Family Medicine

## 2011-02-21 DIAGNOSIS — N644 Mastodynia: Secondary | ICD-10-CM

## 2011-02-21 DIAGNOSIS — T8543XA Leakage of breast prosthesis and implant, initial encounter: Secondary | ICD-10-CM

## 2011-02-21 NOTE — Telephone Encounter (Signed)
Message copied by Eustaquio Boyden on Fri Feb 21, 2011  2:26 PM ------      Message from: Josph Macho      Created: Fri Feb 21, 2011  2:18 PM       Patient notified. She was told by the radiologist to have possible MRI and to see Dr. Benna Dunks in plastics. She will need a referral. Prefers no Friday appts.

## 2011-02-21 NOTE — Telephone Encounter (Signed)
placed referral to plastic surgery in chart.  Will want to proceed with MRI as well.

## 2011-02-25 ENCOUNTER — Ambulatory Visit (INDEPENDENT_AMBULATORY_CARE_PROVIDER_SITE_OTHER)
Admission: RE | Admit: 2011-02-25 | Discharge: 2011-02-25 | Disposition: A | Discharge status: Home / Self Care | Payer: Managed Care, Other (non HMO) | Source: Ambulatory Visit | Attending: Family Medicine | Admitting: Family Medicine

## 2011-02-25 DIAGNOSIS — R918 Other nonspecific abnormal finding of lung field: Secondary | ICD-10-CM

## 2011-02-25 MED ORDER — IOHEXOL 300 MG/ML  SOLN
80.0000 mL | Freq: Once | INTRAMUSCULAR | Status: AC | PRN
  Administered 2011-02-25: 80 mL via INTRAVENOUS

## 2011-02-26 NOTE — Telephone Encounter (Signed)
MRI at [facility] on 03/01/2011 set up thru the Breast Ctr. Appt Dr Benna Dunks on 03/06/2011.

## 2011-02-27 NOTE — Telephone Encounter (Signed)
MRI on 03/01/2011 Surgeon appt on 03/05/2011 with Dr Benna Dunks. MK

## 2011-03-01 ENCOUNTER — Ambulatory Visit
Admission: RE | Admit: 2011-03-01 | Discharge: 2011-03-01 | Disposition: A | Discharge status: Home / Self Care | Payer: Managed Care, Other (non HMO) | Source: Ambulatory Visit | Attending: Family Medicine | Admitting: Family Medicine

## 2011-03-01 DIAGNOSIS — N644 Mastodynia: Secondary | ICD-10-CM

## 2011-04-11 ENCOUNTER — Telehealth: Payer: Self-pay | Admitting: *Deleted

## 2011-04-11 MED ORDER — METHYLPHENIDATE HCL 10 MG PO TABS: 10.0000 mg | ORAL_TABLET | Freq: Two times a day (BID) | ORAL | Status: DC

## 2011-04-11 NOTE — Telephone Encounter (Signed)
Left message on cell phone voicemail, Rx is ready for pick up will be left at front desk.  Patient can also receive her last Twinrix when she comes to pick up Rx or at her convenience.

## 2011-04-11 NOTE — Telephone Encounter (Signed)
Pt inquiring about 3rd TwinRix injection. Pt states that she was due on 03/15/2011 and wants to know if you would like for her to still come in for final injection.? Pt requesting Rx for Ritalin 10mg  BID [do not see in medical HX?]

## 2011-04-11 NOTE — Telephone Encounter (Signed)
Yes she can come in for final injection. I will print out rx for ritalin.

## 2011-04-30 ENCOUNTER — Ambulatory Visit (INDEPENDENT_AMBULATORY_CARE_PROVIDER_SITE_OTHER): Payer: Managed Care, Other (non HMO) | Admitting: Family Medicine

## 2011-04-30 ENCOUNTER — Encounter: Payer: Self-pay | Admitting: Family Medicine

## 2011-04-30 VITALS — BP 130/70 | HR 78 | Temp 98.6°F | Wt 153.2 lb

## 2011-04-30 DIAGNOSIS — Z23 Encounter for immunization: Secondary | ICD-10-CM

## 2011-04-30 DIAGNOSIS — L52 Erythema nodosum: Secondary | ICD-10-CM

## 2011-04-30 NOTE — Progress Notes (Signed)
  Subjective:    Patient ID: Nicole Perkins, female    DOB: 1946/09/23, 65 y.o.   MRN: [id number]  HPI  65 yo here for rash on right shin x 3 days. Similar symptoms in past, erythema nodosum. Under more stress lately, last time flared during stress as well.  Rash in not painful or itchy. Not getting worse.  No systemic symptoms.   Review of Systems    See HPI Patient reports no  vision/ hearing changes,anorexia, weight change, fever ,adenopathy, persistant / recurrent hoarseness, swallowing issues, chest pain, edema,persistant / recurrent cough, hemoptysis, dyspnea(rest, exertional, paroxysmal nocturnal), gastrointestinal  bleeding (melena, rectal bleeding), abdominal pain, excessive heart burn, GU symptoms(dysuria, hematuria, pyuria, voiding/incontinence  Issues) syncope, focal weakness, severe memory loss, depression, anxiety, abnormal bruising/bleeding, major joint swelling, breast masses or abnormal vaginal bleeding.    Objective:   Physical Exam  BP 130/70  Pulse 78  Temp(Src) 98.6 F (37 C) (Oral)  Wt 153 lb 4 oz (69.514 kg) Gen:  Alert, pleasant, NAD HEENT: MMM, PERRL Skin:  Right shin- large, palpable raised mildlly erythematous lesions, otherwise unremarkable, lesions NTTP       Assessment & Plan:   1. Erythema nodosum    New.  Extensive work up in past. Reassurance provided. Advised watchful waiting as in the past, symptoms resolved spontaneously. She will touch base next week with update of symptoms. The patient indicates understanding of these issues and agrees with the plan.

## 2011-06-02 ENCOUNTER — Other Ambulatory Visit: Payer: Self-pay | Admitting: Family Medicine

## 2011-06-02 MED ORDER — DOXYCYCLINE HYCLATE 100 MG PO TABS: 100.0000 mg | ORAL_TABLET | Freq: Two times a day (BID) | ORAL | Status: DC

## 2011-06-02 MED ORDER — DOXYCYCLINE HYCLATE 100 MG PO TABS: 100.0000 mg | ORAL_TABLET | Freq: Two times a day (BID) | ORAL | Status: AC

## 2011-06-23 ENCOUNTER — Encounter: Payer: Self-pay | Admitting: Internal Medicine

## 2011-06-23 ENCOUNTER — Ambulatory Visit (INDEPENDENT_AMBULATORY_CARE_PROVIDER_SITE_OTHER): Payer: Managed Care, Other (non HMO) | Admitting: Internal Medicine

## 2011-06-23 VITALS — BP 88/53 | HR 61 | Temp 97.5°F | Ht 65.0 in | Wt 149.0 lb

## 2011-06-23 DIAGNOSIS — Z23 Encounter for immunization: Secondary | ICD-10-CM

## 2011-06-23 DIAGNOSIS — L259 Unspecified contact dermatitis, unspecified cause: Secondary | ICD-10-CM

## 2011-06-23 MED ORDER — OMEGA-3-ACID ETHYL ESTERS 1 G PO CAPS: 2.0000 g | ORAL_CAPSULE | Freq: Two times a day (BID) | ORAL | Status: DC

## 2011-06-23 MED ORDER — METHYLPHENIDATE HCL 10 MG PO TABS: 10.0000 mg | ORAL_TABLET | Freq: Two times a day (BID) | ORAL | Status: DC

## 2011-06-23 NOTE — Patient Instructions (Signed)
Please try fexofenadine 180mg  daily or cetirizine 10mg  daily for the itching If it really gets worse, call for prescription for prednisone

## 2011-06-23 NOTE — Progress Notes (Signed)
  Subjective:    Patient ID: Nicole Perkins, female    DOB: October 10, 1945, 65 y.o.   MRN: [id number]  HPI Has red rash on left leg>right leg Son thinks it is from seed ticks She had been walking in woods by his house on Friday Saturday noted the rash  Itchy but no meds now Hasn't taken any meds or topical Rx  No fever  Current Outpatient Prescriptions on File Prior to Visit  Medication Sig Dispense Refill  . alendronate (FOSAMAX) 70 MG tablet Take 1 tablet (70 mg total) by mouth every 7 (seven) days. Take with a full glass of water on an empty stomach.  12 tablet  3  . cholecalciferol (VITAMIN D) 1000 UNITS tablet Take 3,000 Units by mouth daily.       Marland Kitchen dipyridamole-aspirin (AGGRENOX) 25-200 MG per 12 hr capsule Take 1 capsule by mouth daily.  90 capsule  3  . escitalopram (LEXAPRO) 20 MG tablet Take 1 tablet (20 mg total) by mouth daily.  10 tablet  0  . gabapentin (NEURONTIN) 300 MG capsule Take 1 capsule (300 mg total) by mouth 2 (two) times daily.  180 capsule  3  . lamoTRIgine (LAMICTAL) 200 MG tablet Take 1 tablet (200 mg total) by mouth daily.  90 tablet  3  . methylphenidate (RITALIN) 10 MG tablet Take 1 tablet (10 mg total) by mouth 2 (two) times daily.  60 tablet  0  . omega-3 acid ethyl esters (LOVAZA) 1 G capsule Take 2 g by mouth 2 (two) times daily.        Marland Kitchen omeprazole (PRILOSEC) 40 MG capsule Take 1 capsule (40 mg total) by mouth 2 (two) times daily.  180 capsule  3  . simvastatin (ZOCOR) 40 MG tablet Take 1 tablet (40 mg total) by mouth daily.  90 tablet  3  . traZODone (DESYREL) 100 MG tablet Take 2 tablets (200 mg total) by mouth daily.  180 tablet  3    No Known Allergies  Past Medical History  Diagnosis Date  . Mood disorder     NOS  . Fibromyalgia   . GERD (gastroesophageal reflux disease)   . Hyperlipidemia   . IBS (irritable bowel syndrome)   . History of colonic polyps     Past Surgical History  Procedure Date  . Thyroidectomy 2007    partial   .  Cosmetic surgery   . Cholecystectomy 1995    No family history on file.  History   Social History  . Marital Status: Single    Spouse Name: N/A    Number of Children: 1  . Years of Education: N/A   Occupational History  . Retired Child psychotherapist    Social History Main Topics  . Smoking status: Former Games developer  . Smokeless tobacco: Not on file  . Alcohol Use: Yes     Rare  . Drug Use: No  . Sexually Active: Not on file   Other Topics Concern  . Not on file   Social History Narrative  . No narrative on file   Review of Systems Did have possible Lyme disease with ring like rash---finished doxy fairly recently    Objective:   Physical Exam  Constitutional: She appears well-developed and well-nourished. No distress.  Skin:       Scattered blanching red papules on thighs L>R No inflammation          Assessment & Plan:

## 2011-06-23 NOTE — Assessment & Plan Note (Signed)
Seems to be from contact with ?seed ticks Discussed that this is self limited Will try fexofenadine &/or zyrtec for itching If worsens, would try brief trial of prednisone

## 2011-07-03 ENCOUNTER — Ambulatory Visit (INDEPENDENT_AMBULATORY_CARE_PROVIDER_SITE_OTHER): Payer: Managed Care, Other (non HMO) | Admitting: Family Medicine

## 2011-07-03 ENCOUNTER — Encounter: Payer: Self-pay | Admitting: Family Medicine

## 2011-07-03 ENCOUNTER — Telehealth: Payer: Self-pay | Admitting: *Deleted

## 2011-07-03 VITALS — BP 92/62 | HR 81 | Temp 97.8°F | Ht 65.0 in | Wt 145.5 lb

## 2011-07-03 DIAGNOSIS — M5412 Radiculopathy, cervical region: Secondary | ICD-10-CM

## 2011-07-03 NOTE — Patient Instructions (Signed)
Handout given

## 2011-07-03 NOTE — Telephone Encounter (Signed)
Fax from [facility] asking for correct quantity or directions on [place] script, please advise should it be 360?  Form on your desk

## 2011-07-03 NOTE — Progress Notes (Signed)
Subjective:    Patient ID: Nicole Perkins, female    DOB: 02-14-1946, 65 y.o.   MRN: [id number]  HPI  65 yo here for right sided arm pain.  Has been working for presidential election and has been spending hours at her computer and on the phone.  Several nights ago, was typing on her computer and had a severe pain that radiated from top of her shoulder, all the way down her arm to the tip of her 3rd digit.  Pain resolved after she sat up for several minutes.  Occurred again the next day. Denies any UE weakness.  Patient Active Problem List  Diagnoses  . NEVUS, ATYPICAL  . HYPERLIPIDEMIA  . MOOD DISORDER  . TIA  . BRONCHITIS  . OTHER DISEASES OF LUNG NOT ELSEWHERE CLASSIFIED  . GERD  . ERYTHEMA NODOSUM  . SACROILIITIS  . PAIN IN SOFT TISSUES OF LIMB  . OSTEOPENIA  . ABNORMAL MAMMOGRAM, RIGHT BREAST  . COLONIC POLYPS, HX OF  . Right foot pain  . ADHD (attention deficit hyperactivity disorder)  . Breast implant rupture  . Acute contact dermatitis  . Cervical radiculopathy at C7   Past Medical History  Diagnosis Date  . Mood disorder     NOS  . Fibromyalgia   . GERD (gastroesophageal reflux disease)   . Hyperlipidemia   . IBS (irritable bowel syndrome)   . History of colonic polyps    Past Surgical History  Procedure Date  . Thyroidectomy 2007    partial   . Cosmetic surgery   . Cholecystectomy 1995   History  Substance Use Topics  . Smoking status: Former Games developer  . Smokeless tobacco: Not on file  . Alcohol Use: Yes     Rare   No family history on file. No Known Allergies Current Outpatient Prescriptions on File Prior to Visit  Medication Sig Dispense Refill  . alendronate (FOSAMAX) 70 MG tablet Take 1 tablet (70 mg total) by mouth every 7 (seven) days. Take with a full glass of water on an empty stomach.  12 tablet  3  . cholecalciferol (VITAMIN D) 1000 UNITS tablet Take 3,000 Units by mouth daily.       Marland Kitchen dipyridamole-aspirin (AGGRENOX) 25-200 MG per 12 hr  capsule Take 1 capsule by mouth daily.  90 capsule  3  . escitalopram (LEXAPRO) 20 MG tablet Take 1 tablet (20 mg total) by mouth daily.  10 tablet  0  . gabapentin (NEURONTIN) 300 MG capsule Take 1 capsule (300 mg total) by mouth 2 (two) times daily.  180 capsule  3  . lamoTRIgine (LAMICTAL) 200 MG tablet Take 1 tablet (200 mg total) by mouth daily.  90 tablet  3  . methylphenidate (RITALIN) 10 MG tablet Take 1 tablet (10 mg total) by mouth 2 (two) times daily.  60 tablet  0  . niacin (NIASPAN) 1000 MG CR tablet Take 1,000 mg by mouth at bedtime.        Marland Kitchen omega-3 acid ethyl esters (LOVAZA) 1 G capsule Take 2 capsules (2 g total) by mouth 2 (two) times daily.  180 capsule  3  . omeprazole (PRILOSEC) 40 MG capsule Take 1 capsule (40 mg total) by mouth 2 (two) times daily.  180 capsule  3  . simvastatin (ZOCOR) 40 MG tablet Take 1 tablet (40 mg total) by mouth daily.  90 tablet  3  . traZODone (DESYREL) 100 MG tablet Take 2 tablets (200 mg total) by mouth daily.  180  tablet  3   The PMH, PSH, Social History, Family History, Medications, and allergies have been reviewed in [facility], and have been updated if relevant.    Review of Systems    See HPI Objective:   Physical Exam BP 92/62  Pulse 81  Temp(Src) 97.8 F (36.6 C) (Oral)  Wt 145 lb 8 oz (65.998 kg)  General:  Well-developed,well-nourished,in no acute distress; alert,appropriate and cooperative throughout examination Head:  normocephalic and atraumatic.   Eyes:  vision grossly intact, pupils equal, pupils round, and pupils reactive to light.   Ears:  R ear normal and L ear normal.   Nose:  no external deformity.   Mouth:  good dentition.   Msk:  No deformity or scoliosis noted of thoracic or lumbar spine, pos spurlings, neg empty can, neg arch sign.   Neurologic:  alert & oriented X3 and gait normal, CN II-XII intact.   Psych:  Cognition and judgment appear intact. Alert and cooperative with normal attention span and concentration. No  apparent delusions, illusions, hallucinations    Assessment & Plan:   1. Cervical radiculopathy at C7   New.  Will try conservative measures first- handout given from sports medicine advisor for exercises and conservative treatment. If no improvement, will try short course of glucocorticoids. Imaging not necessary at this time as no red flag symptoms.

## 2011-07-04 NOTE — Telephone Encounter (Signed)
Form faxed back.

## 2011-07-04 NOTE — Telephone Encounter (Signed)
Yes, should be #360

## 2011-07-15 ENCOUNTER — Telehealth: Payer: Self-pay | Admitting: *Deleted

## 2011-07-15 NOTE — Telephone Encounter (Signed)
Pt will be going to [place] on 11[place]30 and needs doxycycline to take prior to leaving, during the 6 weeks that she's there, and when she returns.  [facility].  She was told that she needs to take this for 2-3 weeks prior and 2-3 weeks after returning.

## 2011-07-16 MED ORDER — DOXYCYCLINE HYCLATE 100 MG PO TABS: ORAL_TABLET | ORAL | Status: DC

## 2011-07-16 NOTE — Telephone Encounter (Signed)
Rx sent 

## 2011-07-16 NOTE — Telephone Encounter (Signed)
Left message on cell phone voicemail advising patient as instructed.   

## 2011-09-01 ENCOUNTER — Other Ambulatory Visit: Payer: Self-pay | Admitting: *Deleted

## 2011-09-01 MED ORDER — METHYLPHENIDATE HCL 10 MG PO TABS: 10.0000 mg | ORAL_TABLET | Freq: Two times a day (BID) | ORAL | Status: DC

## 2011-09-01 NOTE — Telephone Encounter (Signed)
Patient advised via telephone, Rx ready for pick up will be left at front desk. 

## 2011-09-01 NOTE — Telephone Encounter (Signed)
Pt would like to pick this up tomorrow, please call when ready.

## 2011-10-24 ENCOUNTER — Ambulatory Visit (INDEPENDENT_AMBULATORY_CARE_PROVIDER_SITE_OTHER): Payer: BC Managed Care – PPO | Admitting: Family Medicine

## 2011-10-24 ENCOUNTER — Encounter: Payer: Self-pay | Admitting: Family Medicine

## 2011-10-24 VITALS — BP 126/80 | HR 64 | Temp 98.3°F

## 2011-10-24 DIAGNOSIS — K219 Gastro-esophageal reflux disease without esophagitis: Secondary | ICD-10-CM

## 2011-10-24 DIAGNOSIS — M5412 Radiculopathy, cervical region: Secondary | ICD-10-CM

## 2011-10-24 DIAGNOSIS — R079 Chest pain, unspecified: Secondary | ICD-10-CM | POA: Insufficient documentation

## 2011-10-24 DIAGNOSIS — S4980XA Other specified injuries of shoulder and upper arm, unspecified arm, initial encounter: Secondary | ICD-10-CM

## 2011-10-24 DIAGNOSIS — S46009A Unspecified injury of muscle(s) and tendon(s) of the rotator cuff of unspecified shoulder, initial encounter: Secondary | ICD-10-CM | POA: Insufficient documentation

## 2011-10-24 NOTE — Progress Notes (Signed)
Subjective:    Patient ID: Nicole Perkins, female    DOB: 1946/03/21, 66 y.o.   MRN: [id number]  HPI  45 here for several symptoms.  Just returned from visiting her family in [place] on 10/15/2011.  Prior to leaving, she and her family at at the beach and everyone developed GI symptoms such as nausea and vomiting.  On the way the airport, Nicole Perkins felt a little queezy and started feeling some epigastric pain that she describes as heartburn. No radiation of pain.  As she was boarding the plane she vomited once. She proceeded to have body aches and diarrhea throughout the course of the flight.  Since she has been back, still having non exertional epigastric pain. On omeprazole 40 mg twice daily and typically does not have heartburn but did cut back to taking it once a day while she was in [place].  Just prior to having GI symptoms ( a few days before), she started developing left upper arm "aching."  Now both of her upper arms "ache," worsened by movement.  Describes it as "limiting range of motion."  Was playing and holding children the entire trip to [place].  Also carrying heavy bags.  No recurrent nausea, vomiting or diaphoresis.  Strong FH of CAD.  Had neg stress test many years ago.  On aggrenox.  Patient Active Problem List  Diagnoses  . NEVUS, ATYPICAL  . HYPERLIPIDEMIA  . MOOD DISORDER  . TIA  . BRONCHITIS  . OTHER DISEASES OF LUNG NOT ELSEWHERE CLASSIFIED  . GERD  . ERYTHEMA NODOSUM  . SACROILIITIS  . PAIN IN SOFT TISSUES OF LIMB  . OSTEOPENIA  . ABNORMAL MAMMOGRAM, RIGHT BREAST  . COLONIC POLYPS, HX OF  . Right foot pain  . ADHD (attention deficit hyperactivity disorder)  . Breast implant rupture  . Acute contact dermatitis  . Cervical radiculopathy at C7   Past Medical History  Diagnosis Date  . Mood disorder     NOS  . Fibromyalgia   . GERD (gastroesophageal reflux disease)   . Hyperlipidemia   . IBS (irritable bowel syndrome)   . History of colonic  polyps    Past Surgical History  Procedure Date  . Thyroidectomy 2007    partial   . Cosmetic surgery   . Cholecystectomy 1995   History  Substance Use Topics  . Smoking status: Former Games developer  . Smokeless tobacco: Not on file  . Alcohol Use: Yes     Rare   No family history on file. No Known Allergies Current Outpatient Prescriptions on File Prior to Visit  Medication Sig Dispense Refill  . alendronate (FOSAMAX) 70 MG tablet Take 1 tablet (70 mg total) by mouth every 7 (seven) days. Take with a full glass of water on an empty stomach.  12 tablet  3  . cholecalciferol (VITAMIN D) 1000 UNITS tablet Take 3,000 Units by mouth daily.       Marland Kitchen dipyridamole-aspirin (AGGRENOX) 25-200 MG per 12 hr capsule Take 1 capsule by mouth daily.  90 capsule  3  . doxycycline (VIBRA-TABS) 100 MG tablet 1 tab po daily- start 1-2 days prior to exposure, continue taking 1 tab po daily while in [place] and for 4 weeks after you return.  180 tablet  0  . escitalopram (LEXAPRO) 20 MG tablet Take 1 tablet (20 mg total) by mouth daily.  10 tablet  0  . gabapentin (NEURONTIN) 300 MG capsule Take 1 capsule (300 mg total) by mouth 2 (two)  times daily.  180 capsule  3  . lamoTRIgine (LAMICTAL) 200 MG tablet Take 1 tablet (200 mg total) by mouth daily.  90 tablet  3  . methylphenidate (RITALIN) 10 MG tablet Take 1 tablet (10 mg total) by mouth 2 (two) times daily.  60 tablet  0  . niacin (NIASPAN) 1000 MG CR tablet Take 1,000 mg by mouth at bedtime.        Marland Kitchen omega-3 acid ethyl esters (LOVAZA) 1 G capsule Take 2 capsules (2 g total) by mouth 2 (two) times daily.  180 capsule  3  . omeprazole (PRILOSEC) 40 MG capsule Take 1 capsule (40 mg total) by mouth 2 (two) times daily.  180 capsule  3  . simvastatin (ZOCOR) 40 MG tablet Take 1 tablet (40 mg total) by mouth daily.  90 tablet  3  . traZODone (DESYREL) 100 MG tablet Take 2 tablets (200 mg total) by mouth daily.  180 tablet  3   The PMH, PSH, Social History, Family  History, Medications, and allergies have been reviewed in [facility], and have been updated if relevant.    Review of Systems    See HPI Patient reports no  vision/ hearing changes,anorexia, weight change, fever ,adenopathy, persistant / recurrent hoarseness, swallowing issues,  edema,persistant / recurrent cough, hemoptysis, dyspnea(rest, exertional, paroxysmal nocturnal), gastrointestinal  bleeding (melena, rectal bleeding), abdominal pain  Objective:   Physical Exam BP 126/80  Pulse 64  Temp(Src) 98.3 F (36.8 C) (Oral)  General:  Well-developed,well-nourished,in no acute distress; alert,appropriate and cooperative throughout examination Head:  normocephalic and atraumatic.   Eyes:  vision grossly intact, pupils equal, pupils round, and pupils reactive to light.   Ears:  R ear normal and L ear normal.   Nose:  no external deformity.   Mouth:  good dentition.   Msk:  Left arm:  Pos arch sign, pos empty can, normal strength bilaterally  Neurologic:  alert & oriented X3 and gait normal, CN II-XII intact.   Psych:  Cognition and judgment appear intact. Alert and cooperative with normal attention span and concentration. No apparent delusions, illusions, hallucinations    Assessment & Plan:   1. GERD  EKG- NSR, ?left atrial enlargement. History reassuring for non cardiac- likely due to decreasing dose of omeprazole in combination with gastroenteritis. Consider adding pepcid to omeprazole for a week or two.   2. Rotator cuff injury  New- will refer to ortho near [place] for imaging, PT and further work up. Ambulatory referral to Orthopedic Surgery  3. Chest pain  Likely GERD but will refer to cardiology given strong FH of CAD and atrial enlargement- ?stress test and echo. Ambulatory referral to Cardiology

## 2011-10-24 NOTE — Patient Instructions (Signed)
It is so lovely to see you. Please send my love to the [facility]! You can try adding Pepcid over the counter to your Prilosec. Stop by to see Shirlee Limerick on your way out.

## 2011-11-13 ENCOUNTER — Encounter: Payer: Self-pay | Admitting: Cardiovascular Disease

## 2011-11-13 ENCOUNTER — Ambulatory Visit (INDEPENDENT_AMBULATORY_CARE_PROVIDER_SITE_OTHER): Payer: BC Managed Care – PPO | Admitting: Cardiovascular Disease

## 2011-11-13 VITALS — BP 98/64 | HR 62 | Ht 65.0 in | Wt 143.0 lb

## 2011-11-13 DIAGNOSIS — E785 Hyperlipidemia, unspecified: Secondary | ICD-10-CM | POA: Diagnosis not present

## 2011-11-13 DIAGNOSIS — G459 Transient cerebral ischemic attack, unspecified: Secondary | ICD-10-CM

## 2011-11-13 DIAGNOSIS — I6350 Cerebral infarction due to unspecified occlusion or stenosis of unspecified cerebral artery: Secondary | ICD-10-CM | POA: Diagnosis not present

## 2011-11-13 DIAGNOSIS — I639 Cerebral infarction, unspecified: Secondary | ICD-10-CM

## 2011-11-13 DIAGNOSIS — R079 Chest pain, unspecified: Secondary | ICD-10-CM

## 2011-11-13 NOTE — Patient Instructions (Addendum)
You are doing well. No medication changes were made.  We will schedule you for an echocardiogram with bubble study, given history of CVA We will also schedule a treadmill study to examine your chest pain  Please call us if you have new issues that need to be addressed  We will call you with the results

## 2011-11-13 NOTE — Assessment & Plan Note (Signed)
She reports having a previous stroke. MRA suggested a possible finding concerning for embolic issue. Given her previous event to the eye prior to the event and 2011, I've encouraged her to stay on her aspirin. She could discuss with neurology whether the Aggrenox is needed this far after the event or whether she could change to 81 mg of aspirin ( possibly two aspirin).  We did search for a saline contrast bubble study to rule out PFO. One was not done with her previous echocardiogram. She is very interested in learning if she might have a PFO as the cause of her previous symptoms. We will repeat the echocardiogram with saline contrast to evaluate her interatrial septum.   If she had an additional event, she would likely need warfarin, evaluation for PFO ( possible closure).

## 2011-11-13 NOTE — Assessment & Plan Note (Signed)
Etiology of her chest pain is probably atypical and noncardiac in nature though she is concerned given her strong family history. We have ordered a routine treadmill study.

## 2011-11-13 NOTE — Assessment & Plan Note (Signed)
We have suggested she followup with Dr. Dayton Martes at her next physical for a fasting lipid panel. Previous lab work from 2011 was excellent. She reports labs from April 2012 were not fasting. We have suggested she stay on her current medication regimen . If money is an issue, she could try over-the-counter fish oil.

## 2011-11-13 NOTE — Progress Notes (Signed)
Patient ID: Nicole Perkins, female    DOB: July 27, 1946, 66 y.o.   MRN: [id number]  HPI Comments: Ms. 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  She reports that during a recent trip to [place], she had worsening chest tightness. Which she attributed to heartburn. Her symptoms seem to have resolved with more proton pump inhibitor and Pepcid. She does not do exercise on a regular basis. She has had vague arm and chest discomfort. She is able to do her ADLs and shopping without any significant pain. She does report having a significant family history of coronary artery disease ( brother and cousin). No significant smoking history, no diabetes.  EKG shows normal sinus rhythm with rate 62 beats per minute with no significant ST or T wave changes  Echocardiogram 2 years ago was essentially normal. Saline contrast bubble study was not performed. Transesophageal echo was recommended if clinically indicated given her recent stroke. She was started on Aggrenox. MRA did not show significant carotid arterial disease.   Outpatient Encounter Prescriptions as of 11/13/2011  Medication Sig Dispense Refill  . alendronate (FOSAMAX) 70 MG tablet Take 1 tablet (70 mg total) by mouth every 7 (seven) days. Take with a full glass of water on an empty stomach.  12 tablet  3  . cholecalciferol (VITAMIN D) 1000 UNITS tablet Take 3,000 Units by mouth daily.       Marland Kitchen dipyridamole-aspirin (AGGRENOX) 25-200 MG per 12 hr capsule Take 1 capsule by mouth daily.  90 capsule  3  . escitalopram (LEXAPRO) 20 MG tablet Take 1 tablet (20 mg total) by mouth daily.  10 tablet  0  . gabapentin (NEURONTIN) 300 MG capsule Take 1 capsule (300 mg total) by mouth 2 (two) times daily.  180 capsule  3  . lamoTRIgine (LAMICTAL) 200 MG tablet Take 1  tablet (200 mg total) by mouth daily.  90 tablet  3  . loteprednol (LOTEMAX) 0.5 % ophthalmic suspension Place 1 drop into both eyes 4 (four) times daily. Place drops in both eyes for 2 weeks then stop as directed      . methylphenidate (RITALIN) 10 MG tablet Take 1 tablet (10 mg total) by mouth 2 (two) times daily.  60 tablet  0  . niacin (NIASPAN) 1000 MG CR tablet Take 1,000 mg by mouth at bedtime.        Marland Kitchen omega-3 acid ethyl esters (LOVAZA) 1 G capsule Take 2 capsules (2 g total) by mouth 2 (two) times daily.  180 capsule  3  . omeprazole (PRILOSEC) 40 MG capsule Take 1 capsule (40 mg total) by mouth 2 (two) times daily.  180 capsule  3  . simvastatin (ZOCOR) 40 MG tablet Take 1 tablet (40 mg total) by mouth daily.  90 tablet  3  . traZODone (DESYREL) 100 MG tablet Take 2 tablets (200 mg total) by mouth daily.  180 tablet  3  . DISCONTD: doxycycline (VIBRA-TABS) 100 MG tablet 1 tab po daily- start 1-2 days prior to exposure, continue taking 1 tab po daily while in [place] and for 4 weeks after you return.  180 tablet  0     Review of Systems  Constitutional: Negative.   HENT: Negative.   Eyes: Negative.   Respiratory: Negative.   Cardiovascular: Negative.   Gastrointestinal:  Negative.   Musculoskeletal: Negative.   Skin: Negative.   Neurological: Negative.   Hematological: Negative.   Psychiatric/Behavioral: Negative.   All other systems reviewed and are negative.    BP 98/64  Pulse 62  Ht 5\' 5"  (1.651 m)  Wt 143 lb (64.864 kg)  BMI 23.80 kg/m2  Physical Exam  Nursing note and vitals reviewed. Constitutional: She is oriented to person, place, and time. She appears well-developed and well-nourished.  HENT:  Head: Normocephalic.  Nose: Nose normal.  Mouth/Throat: Oropharynx is clear and moist.  Eyes: Conjunctivae are normal. Pupils are equal, round, and reactive to light.  Neck: Normal range of motion. Neck supple. No JVD present.  Cardiovascular: Normal rate, regular  rhythm, S1 normal, S2 normal, normal heart sounds and intact distal pulses.  Exam reveals no gallop and no friction rub.   No murmur heard. Pulmonary/Chest: Effort normal and breath sounds normal. No respiratory distress. She has no wheezes. She has no rales. She exhibits no tenderness.  Abdominal: Soft. Bowel sounds are normal. She exhibits no distension. There is no tenderness.  Musculoskeletal: Normal range of motion. She exhibits no edema and no tenderness.  Lymphadenopathy:    She has no cervical adenopathy.  Neurological: She is alert and oriented to person, place, and time. Coordination normal.  Skin: Skin is warm and dry. No rash noted. No erythema.  Psychiatric: She has a normal mood and affect. Her behavior is normal. Judgment and thought content normal.         Assessment and Plan

## 2011-11-14 ENCOUNTER — Other Ambulatory Visit: Payer: Self-pay | Admitting: Family Medicine

## 2011-11-14 DIAGNOSIS — H04129 Dry eye syndrome of unspecified lacrimal gland: Secondary | ICD-10-CM

## 2011-11-18 ENCOUNTER — Other Ambulatory Visit (INDEPENDENT_AMBULATORY_CARE_PROVIDER_SITE_OTHER): Payer: BC Managed Care – PPO

## 2011-11-18 DIAGNOSIS — H04129 Dry eye syndrome of unspecified lacrimal gland: Secondary | ICD-10-CM

## 2011-11-19 LAB — SJOGRENS SYNDROME-B EXTRACTABLE NUCLEAR ANTIBODY: SSB (La) (ENA) Antibody, IgG: 1 AU/mL (ref <30)

## 2011-11-20 ENCOUNTER — Telehealth: Payer: Self-pay

## 2011-11-20 NOTE — Telephone Encounter (Signed)
Nicole Perkins with Dr Shelly Coss office called and requested special lab test for Sj[place]. Pt has had dry eyes and dry mouth. Explained that our office does not do lab test for other doctor's offices. If pt needs to be seen and evaluated here we will be glad to make appt. Otherwise Dr Durwin Glaze would need to order lab test at a lab of his choice. Nicole Perkins said she will ck with Dr Durwin Glaze and call back if wants appt.

## 2011-12-01 ENCOUNTER — Encounter: Payer: Self-pay | Admitting: Family Medicine

## 2011-12-01 ENCOUNTER — Ambulatory Visit (INDEPENDENT_AMBULATORY_CARE_PROVIDER_SITE_OTHER): Payer: BC Managed Care – PPO | Admitting: Family Medicine

## 2011-12-01 DIAGNOSIS — R079 Chest pain, unspecified: Secondary | ICD-10-CM | POA: Diagnosis not present

## 2011-12-01 MED ORDER — ALENDRONATE SODIUM 70 MG PO TABS: 70.0000 mg | ORAL_TABLET | ORAL | Status: DC

## 2011-12-01 MED ORDER — ESCITALOPRAM OXALATE 20 MG PO TABS: 20.0000 mg | ORAL_TABLET | Freq: Every day | ORAL | Status: DC

## 2011-12-01 MED ORDER — TRAZODONE HCL 100 MG PO TABS: 200.0000 mg | ORAL_TABLET | Freq: Every day | ORAL | Status: DC

## 2011-12-01 MED ORDER — SIMVASTATIN 40 MG PO TABS: 40.0000 mg | ORAL_TABLET | Freq: Every day | ORAL | Status: DC

## 2011-12-01 MED ORDER — METHYLPHENIDATE HCL 10 MG PO TABS: 10.0000 mg | ORAL_TABLET | Freq: Four times a day (QID) | ORAL | Status: DC

## 2011-12-01 MED ORDER — NIACIN ER (ANTIHYPERLIPIDEMIC) 1000 MG PO TBCR: 1000.0000 mg | EXTENDED_RELEASE_TABLET | Freq: Every day | ORAL | Status: DC

## 2011-12-01 MED ORDER — GABAPENTIN 300 MG PO CAPS: 300.0000 mg | ORAL_CAPSULE | Freq: Two times a day (BID) | ORAL | Status: DC

## 2011-12-01 MED ORDER — OMEPRAZOLE 40 MG PO CPDR: 40.0000 mg | DELAYED_RELEASE_CAPSULE | Freq: Two times a day (BID) | ORAL | Status: DC

## 2011-12-01 NOTE — Progress Notes (Signed)
Patient ID: Nicole Perkins, female    DOB: Feb 03, 1946, 66 y.o.   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  Saw Dr. Mariah Perkins- very pleased with encounter. Would like rx for fasting labs to be done at [facility]- lipid panel.  After conversation with Dr. Mariah Perkins, would like to have bubble study to rule out PFO- it has been scheduled.  Otherwise, doing well.  Still has some arm pain although much more intermittent in nature.    Outpatient Encounter Prescriptions as of 12/01/2011  Medication Sig Dispense Refill  . alendronate (FOSAMAX) 70 MG tablet Take 1 tablet (70 mg total) by mouth every 7 (seven) days. Take with a full glass of water on an empty stomach.  12 tablet  3  . cholecalciferol (VITAMIN D) 1000 UNITS tablet Take 3,000 Units by mouth daily.       Marland Kitchen escitalopram (LEXAPRO) 20 MG tablet Take 1 tablet (20 mg total) by mouth daily.  90 tablet  3  . gabapentin (NEURONTIN) 300 MG capsule Take 1 capsule (300 mg total) by mouth 2 (two) times daily.  180 capsule  3  . lamoTRIgine (LAMICTAL) 200 MG tablet Take 1 tablet (200 mg total) by mouth daily.  90 tablet  3  . methylphenidate (RITALIN) 10 MG tablet Take 1 tablet (10 mg total) by mouth 4 (four) times daily.  120 tablet  0  . niacin (NIASPAN) 1000 MG CR tablet Take 1 tablet (1,000 mg total) by mouth at bedtime.  90 tablet  3  . omeprazole (PRILOSEC) 40 MG capsule Take 1 capsule (40 mg total) by mouth 2 (two) times daily.  180 capsule  3  . simvastatin (ZOCOR) 40 MG tablet Take 1 tablet (40 mg total) by mouth daily.  90 tablet  3  . traZODone (DESYREL) 100 MG tablet Take 2 tablets (200 mg total) by mouth daily.  180 tablet  3  . DISCONTD: alendronate (FOSAMAX) 70 MG tablet Take 1 tablet (70 mg total) by mouth every 7 (seven) days. Take with a full glass of water on an  empty stomach.  12 tablet  3  . DISCONTD: escitalopram (LEXAPRO) 20 MG tablet Take 1 tablet (20 mg total) by mouth daily.  10 tablet  0  . DISCONTD: gabapentin (NEURONTIN) 300 MG capsule Take 1 capsule (300 mg total) by mouth 2 (two) times daily.  180 capsule  3  . DISCONTD: methylphenidate (RITALIN) 10 MG tablet Take 1 tablet (10 mg total) by mouth 2 (two) times daily.  60 tablet  0  . DISCONTD: niacin (NIASPAN) 1000 MG CR tablet Take 1,000 mg by mouth at bedtime.        Marland Kitchen DISCONTD: omeprazole (PRILOSEC) 40 MG capsule Take 1 capsule (40 mg total) by mouth 2 (two) times daily.  180 capsule  3  . DISCONTD: simvastatin (ZOCOR) 40 MG tablet Take 1 tablet (40 mg total) by mouth daily.  90 tablet  3  . DISCONTD: traZODone (DESYREL) 100 MG tablet Take 2 tablets (200 mg total) by mouth daily.  180 tablet  3  . dipyridamole-aspirin (AGGRENOX) 25-200 MG per 12 hr capsule Take 1 capsule by mouth daily.  90 capsule  3  . loteprednol (LOTEMAX) 0.5 % ophthalmic suspension Place 1 drop into both eyes 4 (four) times daily. Place drops in both eyes  for 2 weeks then stop as directed      . omega-3 acid ethyl esters (LOVAZA) 1 G capsule Take 2 capsules (2 g total) by mouth 2 (two) times daily.  180 capsule  3     Review of Systems  Constitutional: Negative.   HENT: Negative.   Eyes: Negative.   Respiratory: Negative.   Cardiovascular: Negative.   Gastrointestinal: Negative.   Musculoskeletal: Negative.   Skin: Negative.   Neurological: Negative.   Hematological: Negative.   Psychiatric/Behavioral: Negative.   All other systems reviewed and are negative.    BP 120/68  Pulse 60  Temp(Src) 97.9 F (36.6 C) (Oral)  Wt 149 lb (67.586 kg)  Physical Exam  Nursing note and vitals reviewed. Constitutional: She is oriented to person, place, and time. She appears well-developed and well-nourished.  HENT:  Head: Normocephalic.  Cardiovascular: Normal rate, regular rhythm, S1 normal, S2 normal, normal  heart sounds and intact distal pulses.  Exam reveals no gallop and no friction rub.   Neurological: She is alert and oriented to person, place, and time. Coordination normal.  Skin: Skin is warm and dry. No rash noted. No erythema.  Psychiatric: She has a normal mood and affect. Her behavior is normal. Judgment and thought content normal.         Assessment and Plan   1. CP- resolved. Will d/c aggrenox since she had her stroke over a year ago. Start ASA 81 mg daily. Awaiting bubble study.

## 2011-12-01 NOTE — Patient Instructions (Signed)
Go ahead and stop Aggrenox when you run out. We will start Aspirin 81 mg daily when you stop.

## 2011-12-05 ENCOUNTER — Ambulatory Visit (INDEPENDENT_AMBULATORY_CARE_PROVIDER_SITE_OTHER): Payer: BC Managed Care – PPO | Admitting: Cardiovascular Disease

## 2011-12-05 VITALS — BP 100/60 | HR 58 | Ht 65.0 in | Wt 142.0 lb

## 2011-12-05 DIAGNOSIS — R079 Chest pain, unspecified: Secondary | ICD-10-CM | POA: Diagnosis not present

## 2011-12-05 NOTE — Patient Instructions (Signed)
No significant ischemia noted on treadmill stress test. No further workup needed at this time.  Please contact the office if you have symptoms of worsening shortness of breath or chest pain.

## 2011-12-05 NOTE — Progress Notes (Signed)
Exercise Treadmill Test   Treadmill ordered for recent epsiodes of chest pain.  Resting EKG shows NSR with rate of 58 bpm, no significant ST or T wave changes Resting blood pressure of 100/60 Stand bruce protocal was used.  Patient exercised for 9 min 5 sec,  Peak heart rate of 140 bpm.  This was 92% of the maximum predicted heart rate (target heart rate 153). No symptoms of chest pain or lightheadedness were reported at peak stress or in recovery.  Peak Blood pressure recorded was 160/80. Heart rate at 3 minutes in recovery was 83 bpm. No significant ST changes noted consistent with ischemia. She had maximum 1 mm upsloping depression at peak stress.   FINAL IMPRESSION: Normal exercise stress test. No significant EKG changes concerning for ischemia (significant upsloping on ST change at peak stress) Excellent exercise tolerance.

## 2011-12-18 DIAGNOSIS — Z79899 Other long term (current) drug therapy: Secondary | ICD-10-CM | POA: Diagnosis not present

## 2011-12-18 DIAGNOSIS — H3320 Serous retinal detachment, unspecified eye: Secondary | ICD-10-CM | POA: Diagnosis not present

## 2011-12-18 DIAGNOSIS — F329 Major depressive disorder, single episode, unspecified: Secondary | ICD-10-CM | POA: Diagnosis not present

## 2011-12-18 DIAGNOSIS — E7800 Pure hypercholesterolemia, unspecified: Secondary | ICD-10-CM | POA: Diagnosis not present

## 2011-12-18 DIAGNOSIS — H43819 Vitreous degeneration, unspecified eye: Secondary | ICD-10-CM | POA: Diagnosis not present

## 2011-12-18 DIAGNOSIS — H04129 Dry eye syndrome of unspecified lacrimal gland: Secondary | ICD-10-CM | POA: Diagnosis not present

## 2011-12-18 DIAGNOSIS — Z8673 Personal history of transient ischemic attack (TIA), and cerebral infarction without residual deficits: Secondary | ICD-10-CM | POA: Diagnosis not present

## 2011-12-24 DIAGNOSIS — H348192 Central retinal vein occlusion, unspecified eye, stable: Secondary | ICD-10-CM | POA: Diagnosis not present

## 2011-12-24 DIAGNOSIS — H33329 Round hole, unspecified eye: Secondary | ICD-10-CM | POA: Diagnosis not present

## 2011-12-24 DIAGNOSIS — H43819 Vitreous degeneration, unspecified eye: Secondary | ICD-10-CM | POA: Diagnosis not present

## 2011-12-24 DIAGNOSIS — H359 Unspecified retinal disorder: Secondary | ICD-10-CM | POA: Diagnosis not present

## 2012-02-11 DIAGNOSIS — H33329 Round hole, unspecified eye: Secondary | ICD-10-CM | POA: Diagnosis not present

## 2012-02-11 DIAGNOSIS — H359 Unspecified retinal disorder: Secondary | ICD-10-CM | POA: Diagnosis not present

## 2012-02-11 DIAGNOSIS — H43819 Vitreous degeneration, unspecified eye: Secondary | ICD-10-CM | POA: Diagnosis not present

## 2012-02-11 DIAGNOSIS — H348192 Central retinal vein occlusion, unspecified eye, stable: Secondary | ICD-10-CM | POA: Diagnosis not present

## 2012-02-25 ENCOUNTER — Other Ambulatory Visit: Payer: Self-pay | Admitting: Family Medicine

## 2012-03-09 ENCOUNTER — Other Ambulatory Visit: Payer: Self-pay | Admitting: Family Medicine

## 2012-03-09 MED ORDER — POLYMYXIN B-TRIMETHOPRIM 10000-0.1 UNIT/ML-% OP SOLN: 1.0000 [drp] | OPHTHALMIC | Status: AC

## 2012-03-09 NOTE — Progress Notes (Signed)
Pt called- has pink eye from grandchildren. Requesting abx drops. No eye pain, photophobia or redness. Crusty discharge and irritation. Verified NKDA.

## 2012-03-29 DIAGNOSIS — Z1231 Encounter for screening mammogram for malignant neoplasm of breast: Secondary | ICD-10-CM | POA: Diagnosis not present

## 2012-04-13 ENCOUNTER — Encounter: Payer: Self-pay | Admitting: Family Medicine

## 2012-04-16 ENCOUNTER — Encounter: Payer: Self-pay | Admitting: Family Medicine

## 2012-04-16 ENCOUNTER — Encounter: Payer: Self-pay | Admitting: *Deleted

## 2012-04-16 LAB — HM MAMMOGRAPHY: HM Mammogram: NORMAL

## 2012-06-03 ENCOUNTER — Telehealth: Payer: Self-pay | Admitting: Family Medicine

## 2012-06-03 MED ORDER — TRAZODONE HCL 100 MG PO TABS: 100.0000 mg | ORAL_TABLET | Freq: Every day | ORAL | Status: DC

## 2012-06-03 MED ORDER — LAMOTRIGINE 200 MG PO TABS: 200.0000 mg | ORAL_TABLET | Freq: Every day | ORAL | Status: DC

## 2012-06-03 NOTE — Telephone Encounter (Signed)
Patient sent me a text message requesting refills of Lamictal and Trazadone to her pharmacy- in [place]. Rx refilled.

## 2012-06-15 ENCOUNTER — Encounter: Payer: Self-pay | Admitting: *Deleted

## 2012-06-15 ENCOUNTER — Encounter: Payer: Self-pay | Admitting: Family Medicine

## 2012-06-15 ENCOUNTER — Ambulatory Visit (INDEPENDENT_AMBULATORY_CARE_PROVIDER_SITE_OTHER): Payer: Medicare Other | Admitting: Family Medicine

## 2012-06-15 VITALS — BP 100/62 | HR 76 | Temp 98.5°F | Ht 64.75 in | Wt 157.0 lb

## 2012-06-15 DIAGNOSIS — M8580 Other specified disorders of bone density and structure, unspecified site: Secondary | ICD-10-CM

## 2012-06-15 DIAGNOSIS — M899 Disorder of bone, unspecified: Secondary | ICD-10-CM

## 2012-06-15 DIAGNOSIS — Z0000 Encounter for general adult medical examination without abnormal findings: Secondary | ICD-10-CM

## 2012-06-15 DIAGNOSIS — Z780 Asymptomatic menopausal state: Secondary | ICD-10-CM

## 2012-06-15 DIAGNOSIS — E2839 Other primary ovarian failure: Secondary | ICD-10-CM | POA: Diagnosis not present

## 2012-06-15 DIAGNOSIS — N951 Menopausal and female climacteric states: Secondary | ICD-10-CM | POA: Diagnosis not present

## 2012-06-15 DIAGNOSIS — M949 Disorder of cartilage, unspecified: Secondary | ICD-10-CM

## 2012-06-15 DIAGNOSIS — K589 Irritable bowel syndrome without diarrhea: Secondary | ICD-10-CM

## 2012-06-15 DIAGNOSIS — Z13820 Encounter for screening for osteoporosis: Secondary | ICD-10-CM | POA: Diagnosis not present

## 2012-06-15 DIAGNOSIS — E785 Hyperlipidemia, unspecified: Secondary | ICD-10-CM | POA: Diagnosis not present

## 2012-06-15 DIAGNOSIS — N958 Other specified menopausal and perimenopausal disorders: Secondary | ICD-10-CM | POA: Diagnosis not present

## 2012-06-15 LAB — COMPREHENSIVE METABOLIC PANEL
ALT: 16 U/L (ref 0–35)
AST: 15 U/L (ref 0–37)
Albumin: 3.7 g/dL (ref 3.5–5.2)
BUN: 15 mg/dL (ref 6–23)
Calcium: 8.8 mg/dL (ref 8.4–10.5)
Chloride: 105 mEq/L (ref 96–112)
Potassium: 4.4 mEq/L (ref 3.5–5.1)
Sodium: 141 mEq/L (ref 135–145)
Total Protein: 6.6 g/dL (ref 6.0–8.3)

## 2012-06-15 LAB — LIPID PANEL
LDL Cholesterol: 92 mg/dL (ref 0–99)
Total CHOL/HDL Ratio: 3

## 2012-06-15 MED ORDER — TRAZODONE HCL 100 MG PO TABS: ORAL_TABLET | ORAL | Status: DC

## 2012-06-15 MED ORDER — ALIGN 4 MG PO CAPS: 1.0000 | ORAL_CAPSULE | Freq: Every morning | ORAL | Status: DC

## 2012-06-15 MED ORDER — METHYLPHENIDATE HCL 10 MG PO TABS: 10.0000 mg | ORAL_TABLET | Freq: Four times a day (QID) | ORAL | Status: DC

## 2012-06-15 NOTE — Progress Notes (Signed)
Very pleasant 66 yo female here for annual medicare wellness visit. I have personally reviewed the Medicare Annual Wellness questionnaire and have noted 1. The patient's medical and social history 2. Their use of alcohol, tobacco or illicit drugs 3. Their current medications and supplements 4. The patient's functional ability including ADL's, fall risks, home safety risks and hearing or visual             impairment. 5. Diet and physical activities 6. 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 Lab Results  Component Value Date   CHOL  Value: 121        ATP III CLASSIFICATION:  <200     mg/dL   Desirable  366-440  mg/dL   Borderline High  >=347    mg/dL   High        01/28/9562   HDL 32* 05/16/2010   LDLCALC  Value: 72        Total Cholesterol/HDL:CHD Risk Coronary Heart Disease Risk Table                     Men   Women  1/2 Average Risk   3.4   3.3  Average Risk       5.0   4.4  2 X Average Risk   9.6   7.1  3 X Average Risk  23.4   11.0        Use the calculated Patient Ratio above and the CHD Risk Table to determine the patient's CHD Risk.        ATP III CLASSIFICATION (LDL):  <100     mg/dL   Optimal  875-643  mg/dL   Near or Above                    Optimal  130-159  mg/dL   Borderline  329-518  mg/dL   High  >841     mg/dL   Very High 6/60/6301   LDLDIRECT 93.7 01/14/2011   TRIG 85 05/16/2010   CHOLHDL 3.8 05/16/2010   Mood disorder- has been on effexor, noticed that she was more emotional and moody.  Restarted Lamictal and feels MUCH better.  Loose stools- h/o IBS.  Noticed more episodes of diarrhea per month.  No blood or mucous in stool.  No abdominal pain or bloating. Last colonoscopy 12/2010.  Wt Readings from Last 3 Encounters:  06/15/12 157 lb (71.215 kg)  12/05/11 142 lb (64.411 kg)  12/01/11 149 lb (67.586 kg)   Patient Active Problem List  Diagnosis  . NEVUS, ATYPICAL  . HYPERLIPIDEMIA  . MOOD DISORDER  . TIA  . BRONCHITIS  .  OTHER DISEASES OF LUNG NOT ELSEWHERE CLASSIFIED  . GERD  . ERYTHEMA NODOSUM  . SACROILIITIS  . PAIN IN SOFT TISSUES OF LIMB  . OSTEOPENIA  . ABNORMAL MAMMOGRAM, RIGHT BREAST  . COLONIC POLYPS, HX OF  . Right foot pain  . ADHD (attention deficit hyperactivity disorder)  . Breast implant rupture  . Acute contact dermatitis  . Cervical radiculopathy at C7  . Rotator cuff injury  . Chest pain  . Routine general medical examination at a health care facility   Past Medical History  Diagnosis Date  . Mood disorder     NOS  . Fibromyalgia   . GERD (gastroesophageal reflux disease)   . Hyperlipidemia   . IBS (irritable bowel syndrome)   . History of colonic polyps  Past Surgical History  Procedure Date  . Thyroidectomy 2007    partial   . Cosmetic surgery   . Cholecystectomy 1995  . Thumb arthroscopy     left   History  Substance Use Topics  . Smoking status: Former Games developer  . Smokeless tobacco: Not on file  . Alcohol Use: Yes     Rare   Family History  Problem Relation Age of Onset  . Stroke Mother   . Stroke Father   . Heart disease Brother    No Known Allergies Current Outpatient Prescriptions on File Prior to Visit  Medication Sig Dispense Refill  . alendronate (FOSAMAX) 70 MG tablet Take 1 tablet (70 mg total) by mouth every 7 (seven) days. Take with a full glass of water on an empty stomach.  12 tablet  3  . cholecalciferol (VITAMIN D) 1000 UNITS tablet Take 3,000 Units by mouth daily.       Marland Kitchen escitalopram (LEXAPRO) 20 MG tablet Take 1 tablet (20 mg total) by mouth daily.  90 tablet  3  . gabapentin (NEURONTIN) 300 MG capsule Take 1 capsule (300 mg total) by mouth 2 (two) times daily.  180 capsule  3  . lamoTRIgine (LAMICTAL) 200 MG tablet Take 1 tablet (200 mg total) by mouth daily.  30 tablet  3  . loteprednol (LOTEMAX) 0.5 % ophthalmic suspension Place 1 drop into both eyes 4 (four) times daily. Place drops in both eyes for 2 weeks then stop as directed        . niacin (NIASPAN) 1000 MG CR tablet Take 1 tablet (1,000 mg total) by mouth at bedtime.  90 tablet  3  . omega-3 acid ethyl esters (LOVAZA) 1 G capsule Take 2 capsules (2 g total) by mouth 2 (two) times daily.  180 capsule  3  . omeprazole (PRILOSEC) 40 MG capsule Take 1 capsule (40 mg total) by mouth 2 (two) times daily.  180 capsule  3  . simvastatin (ZOCOR) 40 MG tablet Take 1 tablet (40 mg total) by mouth daily.  90 tablet  3  . DISCONTD: traZODone (DESYREL) 100 MG tablet Take 1 tablet (100 mg total) by mouth daily.  30 tablet  3  . methylphenidate (RITALIN) 10 MG tablet Take 1 tablet (10 mg total) by mouth 4 (four) times daily.  120 tablet  0  . DISCONTD: methylphenidate (RITALIN) 10 MG tablet Take 1 tablet (10 mg total) by mouth 2 (two) times daily.  60 tablet  0     The PMH, PSH, Social History, Family History, Medications, and allergies have been reviewed in [facility], and have been updated if relevant.   Review of Systems       See HPI Patient reports no  vision/ hearing changes,anorexia, weight change, fever ,adenopathy, persistant / recurrent hoarseness, swallowing issues, chest pain, edema,persistant / recurrent cough, hemoptysis, dyspnea(rest, exertional, paroxysmal nocturnal), gastrointestinal  bleeding (melena, rectal bleeding), abdominal pain, excessive heart burn, GU symptoms(dysuria, hematuria, pyuria, voiding/incontinence  Issues) syncope, focal weakness, severe memory loss, concerning skin lesions, depression, anxiety, abnormal bruising/bleeding, major joint swelling, breast masses or abnormal vaginal bleeding.     Physical Exam BP 100/62  Pulse 76  Temp 98.5 F (36.9 C)  Ht 5' 4.75" (1.645 m)  Wt 157 lb (71.215 kg)  BMI 26.33 kg/m2   General:  Well-developed,well-nourished,in no acute distress; alert,appropriate and cooperative throughout examination Head:  normocephalic and atraumatic.   Eyes:  vision grossly intact, pupils equal, pupils round, and pupils reactive  to  light.   Ears:  R ear normal and L ear normal.   Lungs:  Normal respiratory effort, chest expands symmetrically. Lungs are clear to auscultation, no crackles or wheezes. Heart:  Normal rate and regular rhythm. S1 and S2 normal without gallop, murmur, click, rub or other extra sounds. Abdomen:  Bowel sounds positive,abdomen soft and non-tender without masses, organomegaly or hernias noted. Msk:  No deformity or scoliosis noted of thoracic or lumbar spine.   Extremities:  No clubbing, cyanosis, edema, or deformity noted with normal full range of motion of all joints.   Neurologic:  alert & oriented X3 and gait normal.   Skin:  Intact without suspicious lesions or rashes Psych:  Cognition and judgment appear intact. Alert and cooperative with normal attention span and concentration. No apparent delusions, illusions, hallucinations  Assessment and Plan: 1. Routine general medical examination at a health care facility  The patients weight, height, BMI and visual acuity have been recorded in the chart I have made referrals, counseling and provided education to the patient based review of the above and I have provided the pt with a written personalized care plan for preventive services.  Comprehensive metabolic panel  2. HYPERLIPIDEMIA  Lipid Panel  3. OSTEOPENIA    4. Post-menopausal  DG Bone Density  5. Osteopenia  DG Bone Density  6. IBS (irritable bowel syndrome)  Trial of Align

## 2012-06-15 NOTE — Patient Instructions (Addendum)
Great to see you!  Increase fiber and water, and try over the counter gas-x (four times a day when necessary) or beano for bloating.   Consider trail of lactose free diet (back off milk)   Trial of align for bloating/IBS symptoms (OTC).

## 2012-06-18 ENCOUNTER — Telehealth: Payer: Self-pay

## 2012-06-18 ENCOUNTER — Other Ambulatory Visit: Payer: Self-pay

## 2012-06-18 DIAGNOSIS — Z952 Presence of prosthetic heart valve: Secondary | ICD-10-CM

## 2012-06-18 NOTE — Telephone Encounter (Signed)
Message copied by Marcelle Overlie on Fri Jun 18, 2012  3:05 PM ------      Message from: Majel Homer C      Created: Fri Jun 18, 2012  2:30 PM      Regarding: RE: bubble       We will need 2 10cc syringes, a 3-way stopcock, normal saline, and an IV needs to be started (or at least an access site).      She will need a full Echo done (she hasn't had one in over 2 years), then the bubble study at the end of the Echo.  I would prefer to have 90 minutes set aside total (instead of the 60 for routine Echo).      I will try to get my hands on the policy and email it to you.      Missy            ----- Message -----         From: Marcelle Overlie, RN         Sent: 06/18/2012  11:39 AM           To: Devaunte Gasparini C Al-Rammal      Subject: FW: bubble                                               Can you help me with this?  I will be glad to help with the bubble study for whenever is convenient for you       ----- Message -----         From: Antonieta Iba, MD         Sent: 06/18/2012   9:56 AM           To: Dianne Dun, MD, Marcelle Overlie, RN      Subject: bubble                                                   We can take care of this.      We can do the bubble study in our office.      Just need the nurse with echo tech together.      We will call her.      thx      Tim             ----- Message -----         From: Dianne Dun, MD         Sent: 06/15/2012  10:02 AM           To: Antonieta Iba, MD            Cleone Slim Tim,      Ms. Sanderson was here to see me today.  She was asking when she could have her bubble study set up.  Do you know if this has been ordered?      Thanks,      [employer]

## 2012-07-13 ENCOUNTER — Other Ambulatory Visit: Payer: Self-pay

## 2012-07-13 ENCOUNTER — Other Ambulatory Visit (INDEPENDENT_AMBULATORY_CARE_PROVIDER_SITE_OTHER): Payer: Medicare Other

## 2012-07-13 DIAGNOSIS — G459 Transient cerebral ischemic attack, unspecified: Secondary | ICD-10-CM | POA: Diagnosis not present

## 2012-07-13 DIAGNOSIS — Z952 Presence of prosthetic heart valve: Secondary | ICD-10-CM

## 2012-08-02 DIAGNOSIS — Z23 Encounter for immunization: Secondary | ICD-10-CM | POA: Diagnosis not present

## 2012-09-30 ENCOUNTER — Other Ambulatory Visit: Payer: Self-pay | Admitting: Family Medicine

## 2012-10-19 ENCOUNTER — Ambulatory Visit (INDEPENDENT_AMBULATORY_CARE_PROVIDER_SITE_OTHER): Payer: Medicare Other | Admitting: Family Medicine

## 2012-10-19 ENCOUNTER — Encounter: Payer: Self-pay | Admitting: Family Medicine

## 2012-10-19 ENCOUNTER — Ambulatory Visit (INDEPENDENT_AMBULATORY_CARE_PROVIDER_SITE_OTHER)
Admission: RE | Admit: 2012-10-19 | Discharge: 2012-10-19 | Disposition: A | Discharge status: Home / Self Care | Payer: Medicare Other | Source: Ambulatory Visit | Attending: Family Medicine | Admitting: Family Medicine

## 2012-10-19 VITALS — BP 100/54 | HR 60 | Temp 98.7°F | Wt 154.8 lb

## 2012-10-19 DIAGNOSIS — R079 Chest pain, unspecified: Secondary | ICD-10-CM | POA: Diagnosis not present

## 2012-10-19 DIAGNOSIS — W19XXXA Unspecified fall, initial encounter: Secondary | ICD-10-CM

## 2012-10-19 DIAGNOSIS — R0781 Pleurodynia: Secondary | ICD-10-CM

## 2012-10-19 DIAGNOSIS — R269 Unspecified abnormalities of gait and mobility: Secondary | ICD-10-CM

## 2012-10-19 DIAGNOSIS — R2689 Other abnormalities of gait and mobility: Secondary | ICD-10-CM | POA: Insufficient documentation

## 2012-10-19 DIAGNOSIS — S298XXA Other specified injuries of thorax, initial encounter: Secondary | ICD-10-CM | POA: Diagnosis not present

## 2012-10-19 MED ORDER — ALENDRONATE SODIUM 70 MG PO TABS: 70.0000 mg | ORAL_TABLET | ORAL | Status: DC

## 2012-10-19 MED ORDER — TRAMADOL HCL 50 MG PO TABS: 50.0000 mg | ORAL_TABLET | Freq: Two times a day (BID) | ORAL | Status: DC | PRN

## 2012-10-19 NOTE — Assessment & Plan Note (Signed)
Neurological exam overall nonfocal, some mild trouble with coordination noted on stance exam. Pt interested in balance training - will refer to PT - prefers PT at [facility]. Hopeful to avoid recurrent falls given h/o osetoporosis. ?CVA related imbalance although pt endorses h/o TIA, not CVA.

## 2012-10-19 NOTE — Progress Notes (Signed)
  Subjective:    Patient ID: Nicole Perkins, female    DOB: [birth year], 67 y.o.   MRN: [id number]  HPI CC: fall, abd pain  4d ago when awoke got out of bed to open blinds, fell backwards.  Fell onto wooden rocking chair and hit left side.  Since then starting to have worsening pain in LUQ and anterior ribcage described as deep bruise, tender to touch, unable to wear bra 2/2 pain.  Pain with deep breath as well as with walking.  Pain constant now.  Leaning forward worsens pain.  Fall - didn't trip over anything.  Felt off balance (some issue with instability in past).  Denies LOC, presyncope, dizziness, vertigo.  Did not hit head.  Has discussed balance training with PCP- had declined in past, now would like to undergo.  On aggrenox 2/2 h/o TIAs x3.  States 3rd TIA affected coordination. Requests refill of fosamax - sent in. Remote h/o migraines.  BP Readings from Last 3 Encounters:  10/19/12 100/54  06/15/12 100/62  12/05/11 100/60    Past Medical History  Diagnosis Date  . Mood disorder     NOS  . Fibromyalgia   . GERD (gastroesophageal reflux disease)   . Hyperlipidemia   . IBS (irritable bowel syndrome)   . History of colonic polyps   . History of stroke     ministroke x3     Review of Systems Per HPI    Objective:   Physical Exam  Nursing note and vitals reviewed. Constitutional: She is oriented to person, place, and time. She appears well-developed and well-nourished. No distress.  HENT:  Head: Normocephalic and atraumatic.  Mouth/Throat: Uvula is midline and oropharynx is clear and moist. No oropharyngeal exudate.  Cardiovascular: Normal rate, regular rhythm, normal heart sounds and intact distal pulses.   No murmur heard. Pulmonary/Chest: Effort normal and breath sounds normal. No respiratory distress. She has no decreased breath sounds. She has no wheezes. She has no rhonchi. She has no rales. She exhibits tenderness and bony tenderness.         No  bruising. Point tender to palpation anterior mid ribs.  Abdominal: Soft. Bowel sounds are normal. She exhibits no distension and no mass. There is tenderness (sore LUQ but no point tenderness to palpation of spleen). There is no rebound and no guarding.  Musculoskeletal: She exhibits no edema.  Neurological: She is alert and oriented to person, place, and time. She has normal strength. No cranial nerve deficit or sensory deficit. She displays a negative Romberg sign. Coordination and gait normal.       Unable to do tandem stance, some imbalance with side by side foot stance Normal FTN. CN 2-12 intact. No nystagmus.       Assessment & Plan:

## 2012-10-19 NOTE — Assessment & Plan Note (Signed)
Anticipate due to anterior ribcage contusion or fracture. Doubt spleen injury. Checked xray today to eval for rib fracture - clear on my read. Red flags to return discussed. Discussed anticipated course of resolution. Treat pain with tylenol scheduled and tramadol for breakthrough pain.  Pt agrees with plan.

## 2012-10-19 NOTE — Patient Instructions (Signed)
xray of left ribcage today. I do think you had rib contusion (bony bruise) or fracture. Treat with tylenol with tramadol for breakthrough pain. This will take 4-6 weeks to fully improve.   Please let us know if worsening ribcage pain, any abdominal pain, any dizziness or passing out or further falls. Pass by Marion's office for referral to physical therapy for balance training.

## 2012-11-06 DIAGNOSIS — R2689 Other abnormalities of gait and mobility: Secondary | ICD-10-CM

## 2012-11-06 HISTORY — DX: Other abnormalities of gait and mobility: R26.89

## 2012-11-09 DIAGNOSIS — IMO0001 Reserved for inherently not codable concepts without codable children: Secondary | ICD-10-CM | POA: Diagnosis not present

## 2012-11-09 DIAGNOSIS — Z8673 Personal history of transient ischemic attack (TIA), and cerebral infarction without residual deficits: Secondary | ICD-10-CM | POA: Diagnosis not present

## 2012-11-09 DIAGNOSIS — M810 Age-related osteoporosis without current pathological fracture: Secondary | ICD-10-CM | POA: Diagnosis not present

## 2012-11-09 DIAGNOSIS — E785 Hyperlipidemia, unspecified: Secondary | ICD-10-CM | POA: Diagnosis not present

## 2012-11-09 DIAGNOSIS — R262 Difficulty in walking, not elsewhere classified: Secondary | ICD-10-CM | POA: Diagnosis not present

## 2012-11-09 DIAGNOSIS — Z9181 History of falling: Secondary | ICD-10-CM | POA: Diagnosis not present

## 2012-11-23 DIAGNOSIS — M810 Age-related osteoporosis without current pathological fracture: Secondary | ICD-10-CM | POA: Diagnosis not present

## 2012-11-23 DIAGNOSIS — Z9181 History of falling: Secondary | ICD-10-CM | POA: Diagnosis not present

## 2012-11-23 DIAGNOSIS — E785 Hyperlipidemia, unspecified: Secondary | ICD-10-CM | POA: Diagnosis not present

## 2012-11-23 DIAGNOSIS — R262 Difficulty in walking, not elsewhere classified: Secondary | ICD-10-CM | POA: Diagnosis not present

## 2012-11-23 DIAGNOSIS — IMO0001 Reserved for inherently not codable concepts without codable children: Secondary | ICD-10-CM | POA: Diagnosis not present

## 2012-11-29 DIAGNOSIS — Z9181 History of falling: Secondary | ICD-10-CM | POA: Diagnosis not present

## 2012-11-29 DIAGNOSIS — R262 Difficulty in walking, not elsewhere classified: Secondary | ICD-10-CM | POA: Diagnosis not present

## 2012-11-29 DIAGNOSIS — IMO0001 Reserved for inherently not codable concepts without codable children: Secondary | ICD-10-CM | POA: Diagnosis not present

## 2012-11-29 DIAGNOSIS — E785 Hyperlipidemia, unspecified: Secondary | ICD-10-CM | POA: Diagnosis not present

## 2012-11-29 DIAGNOSIS — M810 Age-related osteoporosis without current pathological fracture: Secondary | ICD-10-CM | POA: Diagnosis not present

## 2012-12-07 DIAGNOSIS — R262 Difficulty in walking, not elsewhere classified: Secondary | ICD-10-CM | POA: Diagnosis not present

## 2012-12-07 DIAGNOSIS — Z9181 History of falling: Secondary | ICD-10-CM | POA: Diagnosis not present

## 2012-12-07 DIAGNOSIS — Z79899 Other long term (current) drug therapy: Secondary | ICD-10-CM | POA: Diagnosis not present

## 2012-12-08 ENCOUNTER — Other Ambulatory Visit: Payer: Self-pay | Admitting: *Deleted

## 2012-12-08 MED ORDER — METHYLPHENIDATE HCL 10 MG PO TABS: 10.0000 mg | ORAL_TABLET | Freq: Four times a day (QID) | ORAL | Status: DC

## 2012-12-08 MED ORDER — ESCITALOPRAM OXALATE 20 MG PO TABS: 20.0000 mg | ORAL_TABLET | Freq: Every day | ORAL | Status: DC

## 2012-12-08 MED ORDER — TRAZODONE HCL 100 MG PO TABS: ORAL_TABLET | ORAL | Status: DC

## 2012-12-08 MED ORDER — ASPIRIN-DIPYRIDAMOLE ER 25-200 MG PO CP12: 1.0000 | ORAL_CAPSULE | Freq: Every day | ORAL | Status: DC

## 2012-12-08 MED ORDER — OMEPRAZOLE 40 MG PO CPDR: 40.0000 mg | DELAYED_RELEASE_CAPSULE | Freq: Two times a day (BID) | ORAL | Status: DC

## 2012-12-08 MED ORDER — LAMOTRIGINE 200 MG PO TABS: 200.0000 mg | ORAL_TABLET | Freq: Every day | ORAL | Status: DC

## 2012-12-08 NOTE — Telephone Encounter (Signed)
Pt dropped off form asking for refills, pt would need to pick these up? Please advise

## 2012-12-14 DIAGNOSIS — R262 Difficulty in walking, not elsewhere classified: Secondary | ICD-10-CM | POA: Diagnosis not present

## 2012-12-14 DIAGNOSIS — D509 Iron deficiency anemia, unspecified: Secondary | ICD-10-CM | POA: Diagnosis not present

## 2012-12-14 DIAGNOSIS — Z79899 Other long term (current) drug therapy: Secondary | ICD-10-CM | POA: Diagnosis not present

## 2012-12-14 DIAGNOSIS — Z9181 History of falling: Secondary | ICD-10-CM | POA: Diagnosis not present

## 2012-12-15 MED ORDER — TRAZODONE HCL 100 MG PO TABS: ORAL_TABLET | ORAL | Status: DC

## 2012-12-15 MED ORDER — LAMOTRIGINE 200 MG PO TABS: 200.0000 mg | ORAL_TABLET | Freq: Every day | ORAL | Status: DC

## 2012-12-15 MED ORDER — ESCITALOPRAM OXALATE 20 MG PO TABS: 20.0000 mg | ORAL_TABLET | Freq: Every day | ORAL | Status: DC

## 2012-12-15 MED ORDER — ASPIRIN-DIPYRIDAMOLE ER 25-200 MG PO CP12: 1.0000 | ORAL_CAPSULE | Freq: Every day | ORAL | Status: DC

## 2012-12-15 MED ORDER — METHYLPHENIDATE HCL 10 MG PO TABS: 10.0000 mg | ORAL_TABLET | Freq: Four times a day (QID) | ORAL | Status: DC

## 2012-12-15 MED ORDER — OMEPRAZOLE 40 MG PO CPDR: 40.0000 mg | DELAYED_RELEASE_CAPSULE | Freq: Two times a day (BID) | ORAL | Status: DC

## 2012-12-15 NOTE — Telephone Encounter (Signed)
Pt left status of picking up rx for requested refills.Please advise.

## 2012-12-15 NOTE — Telephone Encounter (Signed)
Dr Dayton Martes, phone note from 3/5 says these scripts were printed out, but you may have printed them from your home computer, and they dont print out here when you do that. I spoke with patient and she actually wants these called to pharmacy, all except ritalin.  Ritalin script printed and placed on your desk for signature.  Pt will pick up.

## 2012-12-29 ENCOUNTER — Other Ambulatory Visit: Payer: Self-pay

## 2012-12-29 MED ORDER — SIMVASTATIN 40 MG PO TABS: 40.0000 mg | ORAL_TABLET | Freq: Every day | ORAL | Status: DC

## 2012-12-29 NOTE — Telephone Encounter (Signed)
Pt left note requesting refill simvastatin to [facility]. Pt advised done.

## 2012-12-30 ENCOUNTER — Encounter: Payer: Self-pay | Admitting: Family Medicine

## 2012-12-30 DIAGNOSIS — R262 Difficulty in walking, not elsewhere classified: Secondary | ICD-10-CM | POA: Diagnosis not present

## 2012-12-30 DIAGNOSIS — Z9181 History of falling: Secondary | ICD-10-CM | POA: Diagnosis not present

## 2012-12-30 DIAGNOSIS — Z79899 Other long term (current) drug therapy: Secondary | ICD-10-CM | POA: Diagnosis not present

## 2013-01-03 ENCOUNTER — Telehealth: Payer: Self-pay | Admitting: *Deleted

## 2013-01-03 NOTE — Telephone Encounter (Signed)
Prior Berkley Harvey is needed for twice daily dosing on omeprazole.  Form is on your desk.

## 2013-01-04 NOTE — Telephone Encounter (Signed)
Form faxed

## 2013-01-05 NOTE — Telephone Encounter (Signed)
Nicole Perkins, was this completed or is it still on my desk?

## 2013-01-05 NOTE — Telephone Encounter (Signed)
It has been completed and faxed.

## 2013-01-07 NOTE — Telephone Encounter (Signed)
Prior auth given for omeprazole.  Approval letter placed on doctors desk for signature and scanning.

## 2013-03-04 ENCOUNTER — Other Ambulatory Visit: Payer: Self-pay | Admitting: Family Medicine

## 2013-03-23 DIAGNOSIS — H524 Presbyopia: Secondary | ICD-10-CM | POA: Diagnosis not present

## 2013-03-23 DIAGNOSIS — H5210 Myopia, unspecified eye: Secondary | ICD-10-CM | POA: Diagnosis not present

## 2013-03-23 DIAGNOSIS — H52229 Regular astigmatism, unspecified eye: Secondary | ICD-10-CM | POA: Diagnosis not present

## 2013-03-23 DIAGNOSIS — H348192 Central retinal vein occlusion, unspecified eye, stable: Secondary | ICD-10-CM | POA: Diagnosis not present

## 2013-03-28 ENCOUNTER — Encounter: Payer: Self-pay | Admitting: Family Medicine

## 2013-03-28 ENCOUNTER — Ambulatory Visit (INDEPENDENT_AMBULATORY_CARE_PROVIDER_SITE_OTHER): Payer: Medicare Other | Admitting: Family Medicine

## 2013-03-28 VITALS — BP 130/80 | HR 80 | Temp 98.7°F | Ht 64.5 in | Wt 154.0 lb

## 2013-03-28 DIAGNOSIS — G459 Transient cerebral ischemic attack, unspecified: Secondary | ICD-10-CM | POA: Diagnosis not present

## 2013-03-28 DIAGNOSIS — E785 Hyperlipidemia, unspecified: Secondary | ICD-10-CM | POA: Diagnosis not present

## 2013-03-28 DIAGNOSIS — R159 Full incontinence of feces: Secondary | ICD-10-CM | POA: Diagnosis not present

## 2013-03-28 NOTE — Patient Instructions (Addendum)
Great to see you. Shirlee Limerick will you with your neurology referral.  Please call me anytime.

## 2013-03-28 NOTE — Progress Notes (Signed)
Pleasant 67 yo female here to discuss medications.  H/o TIAs.   Aggrenox- has been taking since her CVA.   MRA suggested a possible finding concerning for embolic issue.  Given her previous event to the eye prior to the event and 2011, Dr. Mariah Milling encouraged her to stay on her aspirin. Bubble study was neg for PFO.  She is anxious about taking ASA only since she had her event on ASA and she is afraid to stop aggrenox but it is so expensive.  Has not followed up with neurology since 2011 or 2012.  Saw Dr. Anne Hahn.    Lab Results  Component Value Date   CHOL 153 06/15/2012   HDL 46.70 06/15/2012   LDLCALC 92 06/15/2012   LDLDIRECT 93.7 01/14/2011   TRIG 74.0 06/15/2012   CHOLHDL 3 06/15/2012   Diarrhea- intermittent episodes of diarrhea.  Felt it was IBS but now she is having weekly episodes of bowel incontinence.  Once woke up and she had soiled the bed.  No blood in stool.  Colonoscopy UTD- 2012.  Wt Readings from Last 3 Encounters:  03/28/13 154 lb (69.854 kg)  10/19/12 154 lb 12 oz (70.194 kg)  06/15/12 157 lb (71.215 kg)   Patient Active Problem List   Diagnosis Date Noted  . Rib pain on left side 10/19/2012  . Fall 10/19/2012  . Imbalance 10/19/2012  . Routine general medical examination at a health care facility 06/15/2012  . IBS (irritable bowel syndrome) 06/15/2012  . Rotator cuff injury 10/24/2011  . Chest pain 10/24/2011  . Cervical radiculopathy at C7 07/03/2011  . Acute contact dermatitis 06/23/2011  . Breast implant rupture 02/21/2011  . Right foot pain 01/14/2011  . ADHD (attention deficit hyperactivity disorder) 01/14/2011  . NEVUS, ATYPICAL 08/16/2010  . SACROILIITIS 06/22/2010  . TIA 05/22/2010  . OTHER DISEASES OF LUNG NOT ELSEWHERE CLASSIFIED 05/22/2010  . OSTEOPENIA 01/21/2010  . PAIN IN SOFT TISSUES OF LIMB 12/12/2009  . ABNORMAL MAMMOGRAM, RIGHT BREAST 12/12/2009  . HYPERLIPIDEMIA 11/28/2009  . MOOD DISORDER 11/28/2009  . BRONCHITIS 11/28/2009  . GERD  11/28/2009  . ERYTHEMA NODOSUM 11/28/2009  . COLONIC POLYPS, HX OF 11/28/2009   Past Medical History  Diagnosis Date  . Mood disorder     NOS  . Fibromyalgia   . GERD (gastroesophageal reflux disease)   . Hyperlipidemia   . IBS (irritable bowel syndrome)   . History of colonic polyps   . History of stroke     ministroke x3  . Imbalance 11/2012    s/p PT at [facility]   Past Surgical History  Procedure Laterality Date  . Thyroidectomy  2007    partial   . Cosmetic surgery    . Cholecystectomy  1995  . Thumb arthroscopy      left   History  Substance Use Topics  . Smoking status: Former Games developer  . Smokeless tobacco: Not on file  . Alcohol Use: Yes     Comment: Rare   Family History  Problem Relation Age of Onset  . Stroke Mother   . Stroke Father   . Heart disease Brother    No Known Allergies Current Outpatient Prescriptions on File Prior to Visit  Medication Sig Dispense Refill  . alendronate (FOSAMAX) 70 MG tablet Take 1 tablet (70 mg total) by mouth every 7 (seven) days. Take with a full glass of water on an empty stomach.  12 tablet  3  . cholecalciferol (VITAMIN D) 1000 UNITS tablet Take  3,000 Units by mouth daily.       Marland Kitchen dipyridamole-aspirin (AGGRENOX) 200-25 MG per 12 hr capsule Take 1 capsule by mouth daily.  90 capsule  3  . escitalopram (LEXAPRO) 20 MG tablet Take 1 tablet (20 mg total) by mouth daily.  90 tablet  3  . gabapentin (NEURONTIN) 300 MG capsule Take 1 capsule (300 mg total) by mouth 2 (two) times daily.  180 capsule  3  . lamoTRIgine (LAMICTAL) 200 MG tablet TAKE 1 TABLET BY MOUTH DAILY  30 tablet  3  . methylphenidate (RITALIN) 10 MG tablet Take 1 tablet (10 mg total) by mouth 4 (four) times daily.  120 tablet  0  . niacin (NIASPAN) 1000 MG CR tablet Take 1 tablet (1,000 mg total) by mouth at bedtime.  90 tablet  3  . omeprazole (PRILOSEC) 40 MG capsule Take 1 capsule (40 mg total) by mouth 2 (two) times daily.  180 capsule  3  . simvastatin  (ZOCOR) 40 MG tablet Take 1 tablet (40 mg total) by mouth daily.  90 tablet  1  . traZODone (DESYREL) 100 MG tablet Take 2 by mouth at bedtime  180 tablet  3   No current facility-administered medications on file prior to visit.     The PMH, PSH, Social History, Family History, Medications, and allergies have been reviewed in [facility], and have been updated if relevant.   Review of Systems       See HPI    Physical Exam BP 130/80  Pulse 80  Temp(Src) 98.7 F (37.1 C)  Ht 5' 4.5" (1.638 m)  Wt 154 lb (69.854 kg)  BMI 26.04 kg/m2   General:  Well-developed,well-nourished,in no acute distress; alert,appropriate and cooperative throughout examination Head:  normocephalic and atraumatic.   Eyes:  vision grossly intact, pupils equal, pupils round, and pupils reactive to light.   Ears:  R ear normal and L ear normal.   Lungs:  Normal respiratory effort, chest expands symmetrically. Lungs are clear to auscultation, no crackles or wheezes. Heart:  Normal rate and regular rhythm. S1 and S2 normal without gallop, murmur, click, rub or other extra sounds. Abdomen:  Bowel sounds positive,abdomen soft and non-tender without masses, organomegaly or hernias noted. Msk:  No deformity or scoliosis noted of thoracic or lumbar spine.   Extremities:  No clubbing, cyanosis, edema, or deformity noted with normal full range of motion of all joints.   Neurologic:  alert & oriented X3 and gait normal.   Skin:  Intact without suspicious lesions or rashes Psych:  Cognition and judgment appear intact. Alert and cooperative with normal attention span and concentration. No apparent delusions, illusions, hallucinations  Assessment and Plan:  1. TIA Discussed tx options.  We should be able to stop aggrenox and continue ASA but she is not comfortable with this. ?Plavix. Will refer to neurology for their opinion. - Ambulatory referral to Neurology  2. HYPERLIPIDEMIA On Niacic, Fish oil, zocor.  3. Stool  incontinence Refer to GI- ? Normal sphincter tone. I am reassured by recent normal colonoscopy but episodes of incontinence does warrant further work up. - Ambulatory referral to Gastroenterology

## 2013-03-29 DIAGNOSIS — H359 Unspecified retinal disorder: Secondary | ICD-10-CM | POA: Diagnosis not present

## 2013-03-29 DIAGNOSIS — H43819 Vitreous degeneration, unspecified eye: Secondary | ICD-10-CM | POA: Diagnosis not present

## 2013-03-29 DIAGNOSIS — H348192 Central retinal vein occlusion, unspecified eye, stable: Secondary | ICD-10-CM | POA: Diagnosis not present

## 2013-03-29 DIAGNOSIS — H35359 Cystoid macular degeneration, unspecified eye: Secondary | ICD-10-CM | POA: Diagnosis not present

## 2013-03-29 DIAGNOSIS — H33329 Round hole, unspecified eye: Secondary | ICD-10-CM | POA: Diagnosis not present

## 2013-03-31 ENCOUNTER — Ambulatory Visit: Payer: Medicare Other | Admitting: Internal Medicine

## 2013-03-31 ENCOUNTER — Ambulatory Visit (INDEPENDENT_AMBULATORY_CARE_PROVIDER_SITE_OTHER): Payer: Medicare Other | Admitting: Internal Medicine

## 2013-03-31 ENCOUNTER — Encounter: Payer: Self-pay | Admitting: Internal Medicine

## 2013-03-31 ENCOUNTER — Telehealth: Payer: Self-pay | Admitting: Internal Medicine

## 2013-03-31 DIAGNOSIS — R159 Full incontinence of feces: Secondary | ICD-10-CM

## 2013-03-31 DIAGNOSIS — R197 Diarrhea, unspecified: Secondary | ICD-10-CM

## 2013-03-31 DIAGNOSIS — B354 Tinea corporis: Secondary | ICD-10-CM | POA: Diagnosis not present

## 2013-03-31 DIAGNOSIS — R109 Unspecified abdominal pain: Secondary | ICD-10-CM

## 2013-03-31 MED ORDER — HYOSCYAMINE SULFATE 0.125 MG SL SUBL: 0.1250 mg | SUBLINGUAL_TABLET | Freq: Three times a day (TID) | SUBLINGUAL | Status: DC

## 2013-03-31 MED ORDER — PEG-KCL-NACL-NASULF-NA ASC-C 100 G PO SOLR: 1.0000 | Freq: Once | ORAL | Status: DC

## 2013-03-31 MED ORDER — LOPERAMIDE HCL 2 MG PO TABS: 2.0000 mg | ORAL_TABLET | Freq: Four times a day (QID) | ORAL | Status: DC | PRN

## 2013-03-31 NOTE — Progress Notes (Signed)
Patient ID: Nicole Perkins, female   DOB: 08/03/1946, 67 y.o.   MRN: [id number] HPI: Nicole Perkins is a 67 yo female with PMH of IBS, cervical radiculopathy, TIA, hyperlipidemia, GERD, erythema nodosum and colon polyps who is seen in consultation at request of Dr. 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 The patient reports this has been present and worsening over the last 6 months. She reports years of alternating diarrhea and constipation with constipation predominance. She is now having fecal soilage and occasionally spontaneous bowel movements with no warning or control. She is wearing adult diapers because of frequent soiling. This can happen with either sitting or walking. Occasionally it is associated with lower abdominal cramping. She seen no blood in her stool or melena. She tried Metamucil and probiotic with no benefit. She is using Imodium usually 3 tablets at a time when her stools are loose or spontaneous without control. This usually stops all bowel movements for a day or so then she'll have usually a normal bowel movement with control followed by subsequent loose stools without control. She denies lower back pain. No weight loss. She's had 2 prior colonoscopies the first with colon polyp the second 9. She is unsure where these were performed but feels like the last one was within the last 1-2 years. These were possibly in [place].  No new medications. He's had only a prior abdominoplasty but no other abdominal surgeries. She did have 3 vaginal births without prolonged labor. Her children were normal birth weight. No other trauma.  Patient Active Problem List   Diagnosis Date Noted  . Rib pain on left side 10/19/2012  . Fall 10/19/2012  . Imbalance 10/19/2012  . Routine general medical examination at a health care facility 06/15/2012  . IBS (irritable bowel syndrome) 06/15/2012  . Rotator cuff injury 10/24/2011  . Chest pain 10/24/2011  . Cervical radiculopathy at C7 07/03/2011  . Acute  contact dermatitis 06/23/2011  . Breast implant rupture 02/21/2011  . Right foot pain 01/14/2011  . ADHD (attention deficit hyperactivity disorder) 01/14/2011  . NEVUS, ATYPICAL 08/16/2010  . SACROILIITIS 06/22/2010  . TIA 05/22/2010  . OTHER DISEASES OF LUNG NOT ELSEWHERE CLASSIFIED 05/22/2010  . OSTEOPENIA 01/21/2010  . PAIN IN SOFT TISSUES OF LIMB 12/12/2009  . ABNORMAL MAMMOGRAM, RIGHT BREAST 12/12/2009  . HYPERLIPIDEMIA 11/28/2009  . MOOD DISORDER 11/28/2009  . BRONCHITIS 11/28/2009  . GERD 11/28/2009  . ERYTHEMA NODOSUM 11/28/2009  . COLONIC POLYPS, HX OF 11/28/2009    Past Surgical History  Procedure Laterality Date  . Thyroidectomy  2007    partial   . Cosmetic surgery    . Cholecystectomy  1995  . Thumb arthroscopy      left    Current Outpatient Prescriptions  Medication Sig Dispense Refill  . alendronate (FOSAMAX) 70 MG tablet Take 1 tablet (70 mg total) by mouth every 7 (seven) days. Take with a full glass of water on an empty stomach.  12 tablet  3  . BIOTIN 5000 PO Take one by mouth daily      . calcium carbonate 200 MG capsule Take 250 mg by mouth 2 (two) times daily with a meal.      . cholecalciferol (VITAMIN D) 1000 UNITS tablet Take 3,000 Units by mouth daily.       Marland Kitchen dipyridamole-aspirin (AGGRENOX) 200-25 MG per 12 hr capsule Take 1 capsule by mouth daily.  90 capsule  3  . escitalopram (LEXAPRO) 20 MG tablet Take 1 tablet (20 mg total) by  mouth daily.  90 tablet  3  . gabapentin (NEURONTIN) 300 MG capsule Take 1 capsule (300 mg total) by mouth 2 (two) times daily.  180 capsule  3  . lamoTRIgine (LAMICTAL) 200 MG tablet TAKE 1 TABLET BY MOUTH DAILY  30 tablet  3  . methylphenidate (RITALIN) 10 MG tablet Take 1 tablet (10 mg total) by mouth 4 (four) times daily.  120 tablet  0  . niacin (NIASPAN) 1000 MG CR tablet Take 1 tablet (1,000 mg total) by mouth at bedtime.  90 tablet  3  . omeprazole (PRILOSEC) 40 MG capsule Take 1 capsule (40 mg total) by mouth 2  (two) times daily.  180 capsule  3  . simvastatin (ZOCOR) 40 MG tablet Take 1 tablet (40 mg total) by mouth daily.  90 tablet  1  . traZODone (DESYREL) 100 MG tablet Take 2 by mouth at bedtime  180 tablet  3  . hyoscyamine (LEVSIN/SL) 0.125 MG SL tablet Place 1 tablet (0.125 mg total) under the tongue 3 (three) times daily.  90 tablet  1  . loperamide (IMODIUM A-D) 2 MG tablet Take 1 tablet (2 mg total) by mouth 4 (four) times daily as needed for diarrhea or loose stools.  30 tablet  0  . peg 3350 powder (MOVIPREP) 100 G SOLR Take 1 kit (100 g total) by mouth once.  1 kit  0   No current facility-administered medications for this visit.    No Known Allergies  Family History  Problem Relation Age of Onset  . Stroke Mother   . Stroke Father   . Heart disease Brother     History  Substance Use Topics  . Smoking status: Former Games developer  . Smokeless tobacco: Never Used  . Alcohol Use: Yes     Comment: Rare    ROS: As per history of present illness, otherwise negative  There were no vitals taken for this visit. Constitutional: Well-developed and well-nourished. No distress. HEENT: Normocephalic and atraumatic. No scleral icterus. Neck: Neck supple. Trachea midline. Cardiovascular: Normal rate, regular rhythm and intact distal pulses. Pulmonary/chest: Effort normal and breath sounds normal. No wheezing, rales or rhonchi. Abdominal: Soft, nontender, nondistended. Bowel sounds active throughout.  Rectal exam: Patch of dermatitis superior to the anus consistent with likely tinea, no external masses or tenderness to palpation. Significantly decreased sphincter tone and impaired squeeze, there is pelvic percent with Valsalva, no internal masses, formed brown stool heme negative in the vault Extremities: no clubbing, cyanosis, or edema Neurological: Alert and oriented to person place and time. Skin: Skin is warm and dry. No rashes noted. Psychiatric: Normal mood and affect. Behavior is  normal.  RELEVANT LABS AND IMAGING: CBC    Component Value Date/Time   WBC 5.4 05/15/2010 0830   RBC 4.80 05/15/2010 0830   HGB 14.5 05/15/2010 0830   HCT 42.2 05/15/2010 0830   PLT 168 05/15/2010 0830   MCV 87.9 05/15/2010 0830   MCH 30.2 05/15/2010 0830   MCHC 34.4 05/15/2010 0830   RDW 13.3 05/15/2010 0830   LYMPHSABS 1.6 05/15/2010 0830   MONOABS 0.4 05/15/2010 0830   EOSABS 0.1 05/15/2010 0830   BASOSABS 0.0 05/15/2010 0830    CMP     Component Value Date/Time   NA 141 06/15/2012 1016   K 4.4 06/15/2012 1016   CL 105 06/15/2012 1016   CO2 28 06/15/2012 1016   GLUCOSE 97 06/15/2012 1016   BUN 15 06/15/2012 1016   CREATININE 0.8 06/15/2012  1016   CALCIUM 8.8 06/15/2012 1016   PROT 6.6 06/15/2012 1016   ALBUMIN 3.7 06/15/2012 1016   AST 15 06/15/2012 1016   ALT 16 06/15/2012 1016   ALKPHOS 41 06/15/2012 1016   BILITOT 0.5 06/15/2012 1016   GFRNONAA 57* 05/16/2010 0417   GFRAA  Value: >60        The eGFR has been calculated using the MDRD equation. This calculation has not been validated in all clinical situations. eGFR's persistently <60 mL/min signify possible Chronic Kidney Disease. 05/16/2010 0417    ASSESSMENT/PLAN:  67 yo female with PMH of IBS, cervical radiculopathy, TIA, hyperlipidemia, GERD, erythema nodosum and colon polyps who is seen in consultation at request of Dr. 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  1. Fecal incontinence -- this is a relatively new problem over the last 6 months. She is having loose stool/diarrhea with incontinence. We discussed the differential including pelvic floor dysfunction, sphincter dysfunction, and other structural versus functional issues. She has had a colonoscopy within the last 2 years but does report is unavailable. Given this is a new problem I have recommended flexible sigmoidoscopy to exclude specific rectal pathology. I would also like to send off stool studies to ensure there is no infectious process, though this is felt less likely. She did not  benefit from bulking her stool with Metamucil and probiotic was not helpful.  Assuming stool studies and flexible sigmoidoscopy negative I would like to start her on scheduled Levsin to be used as an anticholinergic. She can continue to use Imodium on the days when her stools are overly loose. If this entire workup is negative, I will refer her to Dr. Maisie Fus with [facility] for anorectal manometry and balloon expulsion testing.    2.  Tinea -- I will give her Mycolog cream to be used as directed for perianal irritation/fungal infection

## 2013-03-31 NOTE — Patient Instructions (Addendum)
You have been scheduled for a Flexible Sigmoidoscopy propofol. Please follow written instructions given to you at your visit today.  Please pick up your prep kit at the pharmacy within the next 1-3 days. If you use inhalers (even only as needed), please bring them with you on the day of your procedure. Your physician has requested that you go to www.startemmi.com and enter the access code given to you at your visit today. This web site gives a general overview about your procedure. However, you should still follow specific instructions given to you by our office regarding your preparation for the procedure.  Your physician has requested that you go to the basement for t lab work before leaving today  Continue taking imodium   We have sent the following medications to your pharmacy for you to pick up at your convenience: Levsin; please take as directed  You will be scheduled for an appointment [facility]. Make certain to bring a list of current medications, including any over the counter medications or vitamins. Also bring your co-pay if you have one as well as your insurance cards. [facility] is located at [address]. Should you need to reschedule your appointment, please contact them at [phone].                                               We are excited to introduce MyChart, a new best-in-class service that provides you online access to important information in your electronic medical record. We want to make it easier for you to view your health information - all in one secure location - when and where you need it. We expect MyChart will enhance the quality of care and service we provide.  When you register for MyChart, you can:    View your test results.    Request appointments and receive appointment reminders via email.    Request medication renewals.    View your medical history, allergies, medications and immunizations.    Communicate with your physician's office through a password-protected site.    Conveniently print information such as your medication lists.  To find out if MyChart is right for you, please talk to a member of our clinical staff today. We will gladly answer your questions about this free health and wellness tool.  If you are age 16 or older and want a member of your family to have access to your record, you must provide written consent by completing a proxy form available at our office. Please speak to our clinical staff about guidelines regarding accounts for patients younger than age 38.  As you activate your MyChart account and need any technical assistance, please call the MyChart technical support line at [phone]) or email your question to [email or web address]. If you email your question(s), please include your name, a return phone number and the best time to reach you.  If you have non-urgent health-related questions, you can send a message to our office through MyChart at [email or web address]. If you have a medical emergency, call 911.  Thank you for using MyChart as your new health and wellness resource!   MyChart licensed from [employer],  [id number]. Patents Pending.

## 2013-04-01 NOTE — Telephone Encounter (Signed)
lvm for pt mailing voucher for moviorep today. If she has any questions to please call me

## 2013-04-13 ENCOUNTER — Encounter: Payer: Self-pay | Admitting: Family Medicine

## 2013-04-13 ENCOUNTER — Other Ambulatory Visit: Payer: Self-pay

## 2013-04-13 ENCOUNTER — Other Ambulatory Visit: Payer: Self-pay | Admitting: Family Medicine

## 2013-04-13 MED ORDER — METHYLPHENIDATE HCL 10 MG PO TABS: 10.0000 mg | ORAL_TABLET | Freq: Four times a day (QID) | ORAL | Status: DC

## 2013-04-13 MED ORDER — GABAPENTIN 300 MG PO CAPS: 300.0000 mg | ORAL_CAPSULE | Freq: Two times a day (BID) | ORAL | Status: DC

## 2013-04-13 NOTE — Telephone Encounter (Signed)
Script printed is for pt to take 4 times a day, pt sent email stating she takes 3 times a day.  I have sent her an email back asking her to comfirm tid dosing.

## 2013-04-13 NOTE — Telephone Encounter (Signed)
Pt left v/m requesting refill gabapentin 300 mg to [facility].Please advise.

## 2013-04-13 NOTE — Telephone Encounter (Signed)
Script is on your desk for signature.

## 2013-04-14 NOTE — Telephone Encounter (Signed)
Pt left v./m requesting cb [phone].

## 2013-04-14 NOTE — Telephone Encounter (Signed)
Left another message on voice mail asking pt to leave detailed message regarding how she takes the medicine that she requested a refill on yesterday.

## 2013-04-14 NOTE — Telephone Encounter (Signed)
Left message on cell phone voice mail asking pt to call me back regarding refill.

## 2013-04-14 NOTE — Telephone Encounter (Signed)
Pt left v/m that she takes Gabapentin 300 mg one cap twice a day. Any further question pt request cb [phone].

## 2013-04-14 NOTE — Telephone Encounter (Signed)
Left message on voice mail advising patient that we need to verify how she takes ritalin, reminded her that she had requested this yesterday.

## 2013-04-15 ENCOUNTER — Encounter (INDEPENDENT_AMBULATORY_CARE_PROVIDER_SITE_OTHER): Payer: Self-pay | Admitting: General Surgery

## 2013-04-15 ENCOUNTER — Ambulatory Visit (INDEPENDENT_AMBULATORY_CARE_PROVIDER_SITE_OTHER): Payer: Medicare Other | Admitting: General Surgery

## 2013-04-15 VITALS — BP 120/72 | HR 76 | Temp 98.4°F | Resp 18 | Ht 64.5 in | Wt 158.2 lb

## 2013-04-15 DIAGNOSIS — R159 Full incontinence of feces: Secondary | ICD-10-CM | POA: Insufficient documentation

## 2013-04-15 MED ORDER — METHYLPHENIDATE HCL 10 MG PO TABS: 10.0000 mg | ORAL_TABLET | Freq: Three times a day (TID) | ORAL | Status: DC

## 2013-04-15 MED ORDER — GABAPENTIN 300 MG PO CAPS: 300.0000 mg | ORAL_CAPSULE | Freq: Two times a day (BID) | ORAL | Status: DC

## 2013-04-15 NOTE — Progress Notes (Signed)
Chief Complaint  Patient presents with  . New Evaluation    fecal incontinence    HISTORY: Nicole Perkins is a 67 y.o. female who presents to the office with fecal incontinence.  This had been worsening that last 6 mo.   Diarrhea makes the symptoms worse.   It is intermittent in nature.  She takes imodium, which temporarily helps.  When she doesn't take imodium she has leakage ~2 times a day.  Her bowel habits are regular and her bowel movements shift from diarrhea to soft stools.  Her fiber intake is dietary.  She has tried metamucil powder for 6 weeks, but this seemed to make seem worse.  Her last colonoscopy was normal per pt report.  She has had 3 relatively easy vaginal deliveries.  She is incontinent to gas and soft/liquid stool.  She rarely has a formed BM leak out.  She denies any recent changes in her medication.    Past Medical History  Diagnosis Date  . Mood disorder     NOS  . Fibromyalgia   . GERD (gastroesophageal reflux disease)   . Hyperlipidemia   . IBS (irritable bowel syndrome)   . History of colonic polyps   . History of stroke     ministroke x3  . Imbalance 11/2012    s/p PT at [facility]  . Arthritis   . Osteoporosis   . Stroke       Past Surgical History  Procedure Laterality Date  . Thyroidectomy  2007    partial   . Cosmetic surgery    . Cholecystectomy  1995  . Thumb arthroscopy      left        Current Outpatient Prescriptions  Medication Sig Dispense Refill  . alendronate (FOSAMAX) 70 MG tablet Take 1 tablet (70 mg total) by mouth every 7 (seven) days. Take with a full glass of water on an empty stomach.  12 tablet  3  . BIOTIN 5000 PO Take one by mouth daily      . calcium carbonate 200 MG capsule Take 250 mg by mouth 2 (two) times daily with a meal.      . Cholecalciferol (VITAMIN D3) 2000 UNITS capsule Take by mouth. 5 tab by mouth daily      . dipyridamole-aspirin (AGGRENOX) 200-25 MG per 12 hr capsule Take 1 capsule by mouth daily.  90  capsule  3  . escitalopram (LEXAPRO) 20 MG tablet Take 1 tablet (20 mg total) by mouth daily.  90 tablet  3  . hyoscyamine (LEVSIN/SL) 0.125 MG SL tablet Place 1 tablet (0.125 mg total) under the tongue 3 (three) times daily.  90 tablet  1  . lamoTRIgine (LAMICTAL) 200 MG tablet TAKE 1 TABLET BY MOUTH DAILY  30 tablet  3  . loperamide (IMODIUM A-D) 2 MG tablet Take 1 tablet (2 mg total) by mouth 4 (four) times daily as needed for diarrhea or loose stools.  30 tablet  0  . niacin (NIASPAN) 1000 MG CR tablet Take 1 tablet (1,000 mg total) by mouth at bedtime.  90 tablet  3  . omega-3 acid ethyl esters (LOVAZA) 1 G capsule Take by mouth. 2 cap by mouth 2 times a day      . omeprazole (PRILOSEC) 40 MG capsule Take 1 capsule (40 mg total) by mouth 2 (two) times daily.  180 capsule  3  . peg 3350 powder (MOVIPREP) 100 G SOLR Take 1 kit (100 g total) by mouth  once.  1 kit  0  . simvastatin (ZOCOR) 40 MG tablet Take 1 tablet (40 mg total) by mouth daily.  90 tablet  1  . traZODone (DESYREL) 100 MG tablet Take 2 by mouth at bedtime  180 tablet  3  . gabapentin (NEURONTIN) 300 MG capsule Take 1 capsule (300 mg total) by mouth 2 (two) times daily.  180 capsule  0  . methylphenidate (RITALIN) 10 MG tablet Take 1 tablet (10 mg total) by mouth 3 (three) times daily.  90 tablet  0   No current facility-administered medications for this visit.      No Known Allergies    Family History  Problem Relation Age of Onset  . Stroke Mother   . Stroke Father   . Heart disease Brother   . Cancer Brother     History   Social History  . Marital Status: Single    Spouse Name: N/A    Number of Children: 1  . Years of Education: N/A   Occupational History  . Retired Child psychotherapist    Social History Main Topics  . Smoking status: Former Smoker    Types: Cigarettes    Quit date: 10/06/1974  . Smokeless tobacco: Never Used  . Alcohol Use: Yes     Comment: Rare  . Drug Use: No  . Sexually Active: None    Other Topics Concern  . None   Social History Narrative  . None      REVIEW OF SYSTEMS - PERTINENT POSITIVES ONLY: Review of Systems - General ROS: negative for - chills, fever or weight loss Hematological and Lymphatic ROS: negative for - bleeding problems, blood clots or bruising Respiratory ROS: no cough, shortness of breath, or wheezing Cardiovascular ROS: no chest pain or dyspnea on exertion Gastrointestinal ROS: positive for - abdominal pain negative for - nausea/vomiting Genito-Urinary ROS: no dysuria, trouble voiding, or hematuria  EXAM: Filed Vitals:   04/15/13 1410  BP: 120/72  Pulse: 76  Temp: 98.4 F (36.9 C)  Resp: 18    General appearance: alert and cooperative Resp: clear to auscultation bilaterally Cardio: regular rate and rhythm GI: soft, non-tender; bowel sounds normal; no masses,  no organomegaly   Procedure: Anoscopy Surgeon: Nicole Perkins Diagnosis: fecal incontinence  Assistant: Nicole Perkins After the risks and benefits were explained, verbal consent was obtained for above procedure  Anesthesia: none Findings: Moderate resting tone, minimal squeeze pressure, possible anterior sphincter defect, good push.  Minimal hemorrhoid disease    ASSESSMENT AND PLAN: Nicole Perkins is a 67 y.o. F with fecal incontinence that his gotten worse over the last 6 months.  She is scheduled for endoscopic evaluation with Dr Nicole Perkins shortly.  Currently she is doing better on anti-motility agents.  I have recommended that she undergo manometry studies and an anal [place] to evaluate her sphincters.  She has tried fiber therapy in the past for sufficient amounts of time, and this did not help.  I think she may be a good candidate for sacral nerve stimulation if we cannot find a more reversible cause for her symptoms with these upcoming tests.    Nicole Panda, MD Colon and Rectal Surgery / General Surgery [facility]      Visit Diagnoses: 1. Fecal  incontinence     Primary Care Physician: Nicole Mannan, MD

## 2013-04-15 NOTE — Telephone Encounter (Signed)
Left another message asking patient to call me back.

## 2013-04-15 NOTE — Addendum Note (Signed)
Addended by: Eliezer Bottom on: 04/15/2013 02:23 PM   Modules accepted: Orders

## 2013-04-15 NOTE — Patient Instructions (Signed)
Continue medications and treatments that Dr Rhea Belton prescribed.  Will await sigmoidoscopy results and stool studies from his office.  We will set up anal manometry and [place] to evaluate further.  Bowel Incontinence  What is incontinence? Incontinence is the impaired ability to control gas or stool. Its severity ranges from mild difficulty with gas control to severe loss of control over liquid and formed stools. Incontinence to stool is a common problem, but often it is not discussed due to embarassment. What causes incontinence? There are many causes of incontinence. Injury during childbirth is one of the most common causes. These injuries may cause a tear in the anal muscles. The nerves supplying the anal muscles may also be injured. While some injuries may be recognized immediately following childbirth, many others may go unnoticed and not become a problem until later in life. In these situations, a prior childbirth may not be recognized as the cause of incontinence. Anal operations or traumatic injury to the tissue surrounding the anal region similarly can damage the anal muscles and hinder bowel control. Some individuals experience loss of strength in the anal muscles as they age. As a result, a minor control problem in a younger person may become more significant later in life. Diarrhea may be associated with a feeling of urgency or stool leakage due to the frequent liquid stools passing through the anal opening. If bleeding accompanies your bowel movements or you have lack of bowel control, consult your physician. These symptoms may indicate inflammation within the colon (colitis), a rectal tumor or rectal prolapse - all conditions that require prompt evaluation by a physician.    How is the cause of incontinence determined? An initial discussion of the problem with your physician will help establish the degree of control difficulty and its impact on your lifestyle. Many clues to the origin of  incontinence may be found in patient histories. For example, a woman's history of past childbirths is very important. Multiple pregnancies, large weight babies, forceps deliveries, or episiotomies may contribute to muscle or nerve injury at the time of childbirth. In some cases, medical illnesses and medications play a role in problems with control. A physical exam of the anal region should be performed. It may readily identify an obvious injury to the anal muscles. In addition, an ultrasound probe can be used within the anal area to provide a picture of the muscles and show areas in which the anal muscles have been injured. Frequently, additional studies are required to define the anal area more completely. In a test called anal manometry, a small catheter is placed into the anus to record pressure as patients relax and tighten the anal muscles. This test can demonstrate how weak or strong the muscle really is. A separate test may also be conducted to determine if the nerves that go to the anal muscles are functioning properly. What can be done to correct the problem? Treatment of incontinence may include: Marland Kitchen     Dietary changes .     Constipating medications .     Muscle strengthening exercises .     Biofeedback  Injectable bulking agents  Surgical muscle repair  Artificial anal sphincter  Sacral nerve stimulator  After a careful history, physical examination and testing to determine the cause and severity of the problem, treatment can be addressed. Mild problems may be treated very simply with dietary changes and the use of some constipating medications. Diseases which cause inflammation in the rectum, such as colitis, may contribute to  anal control problems. Treating these diseases also may eliminate or improve symptoms of incontinence. Sometimes a change in prescribed medications may help. Your physician also may recommend simple home exercises that may strengthen the anal muscles to help in  mild cases. A type of physical therapy called biofeedback can be used to help patients sense when stool is ready to be evacuated and help strengthen the muscles. Injuries to the anal muscles may be repaired with surgery. Some individuals may benefit from a technique that delivers electrical energy to the skin and muscles surrounding the anus which results in firming and thickening of this area to help with continence. In certain individuals that have nerve damage or anal muscles that are damaged beyond repair, an artificial sphincter may be implanted. The artificial sphincter is a plastic, fluid filled doughnut that is surgically implanted around the damaged anal sphincter. This artificial sphincter keeps the anal canal closed. When an individual wants to have a bowel movement, the fluid can be pumped out of the doughnut to allow the anal canal to open. In extreme cases, patients may find that a colostomy is the best option for improving their quality of life. What is a colon and rectal surgeon? Colon and rectal surgeons are experts in the surgical and non-surgical treatment of diseases of the colon, rectum and anus. They have completed advanced surgical training in the treatment of these diseases as well as full general surgical training. Board-certified colon and rectal surgeons complete residencies in general surgery and colon and rectal surgery, and pass intensive examinations conducted by the American Board of Surgery and the American Board of Colon and Rectal Surgery. They are well-versed in the treatment of both benign and malignant diseases of the colon, rectum and anus and are able to perform routine screening examinations and surgically treat conditions if indicated to do so.  2012 American Society of Colon & Rectal Surgeons

## 2013-04-15 NOTE — Telephone Encounter (Signed)
Script for ritalin placed at front desk for pick up.  Advised patient.

## 2013-04-15 NOTE — Telephone Encounter (Signed)
Spoke with patient.  She states she takes ritalin three times a day, that is all that medicare will allow.  She also needs refill on gabapentin 300 mg's twice a day.

## 2013-04-19 ENCOUNTER — Encounter: Payer: Self-pay | Admitting: Family Medicine

## 2013-04-20 ENCOUNTER — Ambulatory Visit (INDEPENDENT_AMBULATORY_CARE_PROVIDER_SITE_OTHER): Payer: Medicare Other | Admitting: Neurology

## 2013-04-20 ENCOUNTER — Encounter: Payer: Self-pay | Admitting: Neurology

## 2013-04-20 VITALS — BP 112/63 | HR 70 | Ht 65.0 in | Wt 163.0 lb

## 2013-04-20 DIAGNOSIS — G459 Transient cerebral ischemic attack, unspecified: Secondary | ICD-10-CM

## 2013-04-20 DIAGNOSIS — R269 Unspecified abnormalities of gait and mobility: Secondary | ICD-10-CM

## 2013-04-20 DIAGNOSIS — R2689 Other abnormalities of gait and mobility: Secondary | ICD-10-CM

## 2013-04-20 MED ORDER — CLOPIDOGREL BISULFATE 75 MG PO TABS: 75.0000 mg | ORAL_TABLET | Freq: Every day | ORAL | Status: DC

## 2013-04-20 NOTE — Progress Notes (Signed)
Reason for visit: Cerebrovascular disease  Nicole Perkins is a 67 y.o. female  History of present illness:  Nicole Perkins is a 67 year old right-handed white female with a history of cerebrovascular disease. The patient was last seen through this office on 08/02/2010. The patient had an event in August of 2011 where she had become staggery, with a tendency to veer to the left. The patient had symptoms for about 2 hours with resolution. 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 The patient was placed on Aggrenox, and she has done well since that time. The patient however, comes to the office today indicating that she is having difficulty affording Aggrenox, and she wishes to switch to another medication. Previously, a 2-D echocardiogram showed an ejection fraction of 60-65%. A carotid Doppler study was unremarkable, and a transcranial Doppler study also appeared to be unremarkable. The patient is otherwise doing well at this time. No recurrence of symptoms has been noted, but the patient may occasionally stumble or fall. She believes that her balance has never normalized completely since the event in August of 2011.  Past Medical History  Diagnosis Date  . Mood disorder     NOS  . Fibromyalgia   . GERD (gastroesophageal reflux disease)   . Hyperlipidemia   . IBS (irritable bowel syndrome)   . History of colonic polyps   . History of stroke     ministroke x3  . Imbalance 11/2012    s/p PT at [facility]  . Arthritis   . Osteoporosis   . Stroke   . CRVO (central retinal vein occlusion)     Left    Past Surgical History  Procedure Laterality Date  . Thyroidectomy  2007    partial   . Cosmetic surgery    . Cholecystectomy  1995  . Thumb arthroscopy      left    Family History  Problem Relation Age of Onset  . Stroke Mother   . Stroke Father   . Heart disease Brother   . Cancer Brother     Social  history:  reports that she quit smoking about 38 years ago. Her smoking use included Cigarettes. She smoked 0.00 packs per day. She has never used smokeless tobacco. She reports that  drinks alcohol. She reports that she does not use illicit drugs.  Medications:  Current Outpatient Prescriptions on File Prior to Visit  Medication Sig Dispense Refill  . alendronate (FOSAMAX) 70 MG tablet Take 1 tablet (70 mg total) by mouth every 7 (seven) days. Take with a full glass of water on an empty stomach.  12 tablet  3  . BIOTIN 5000 PO Take one by mouth daily      . calcium carbonate 200 MG capsule Take 250 mg by mouth 2 (two) times daily with a meal.      . Cholecalciferol (VITAMIN D3) 2000 UNITS capsule Take 2,000 Units by mouth daily.       Marland Kitchen escitalopram (LEXAPRO) 20 MG tablet Take 1 tablet (20 mg total) by mouth daily.  90 tablet  3  . gabapentin (NEURONTIN) 300 MG capsule Take 1 capsule (300 mg total) by mouth 2 (two) times daily.  180 capsule  0  . hyoscyamine (LEVSIN/SL) 0.125 MG SL tablet Place 1 tablet (0.125 mg total) under the tongue 3 (three) times daily.  90 tablet  1  . lamoTRIgine (LAMICTAL) 200  MG tablet TAKE 1 TABLET BY MOUTH DAILY  30 tablet  3  . loperamide (IMODIUM A-D) 2 MG tablet Take 1 tablet (2 mg total) by mouth 4 (four) times daily as needed for diarrhea or loose stools.  30 tablet  0  . methylphenidate (RITALIN) 10 MG tablet Take 1 tablet (10 mg total) by mouth 3 (three) times daily.  90 tablet  0  . niacin (NIASPAN) 1000 MG CR tablet Take 1 tablet (1,000 mg total) by mouth at bedtime.  90 tablet  3  . omeprazole (PRILOSEC) 40 MG capsule Take 1 capsule (40 mg total) by mouth 2 (two) times daily.  180 capsule  3  . peg 3350 powder (MOVIPREP) 100 G SOLR Take 1 kit (100 g total) by mouth once.  1 kit  0  . simvastatin (ZOCOR) 40 MG tablet Take 1 tablet (40 mg total) by mouth daily.  90 tablet  1  . traZODone (DESYREL) 100 MG tablet Take 2 by mouth at bedtime  180 tablet  3   No  current facility-administered medications on file prior to visit.    Allergies: No Known Allergies  ROS:  Out of a complete 14 system review of symptoms, the patient complains only of the following symptoms, and all other reviewed systems are negative.  Fatigue Snoring Diarrhea Incontinence Achy muscles Tremor Depression, insomnia  Blood pressure 112/63, pulse 70, height 5\' 5"  (1.651 m), weight 163 lb (73.936 kg).  Physical Exam  General: The patient is alert and cooperative at the time of the examination.  Head: Pupils are equal, round, and reactive to light. Discs are flat bilaterally.  Neck: The neck is supple, no carotid bruits are noted.  Respiratory: The respiratory examination is clear.  Cardiovascular: The cardiovascular examination reveals a regular rate and rhythm, no obvious murmurs or rubs are noted.  Skin: Extremities are without significant edema.  Neurologic Exam  Mental status:  Cranial nerves: Facial symmetry is present. There is good sensation of the face to pinprick and soft touch bilaterally. The strength of the facial muscles and the muscles to head turning and shoulder shrug are normal bilaterally. Speech is well enunciated, no aphasia or dysarthria is noted. Extraocular movements are full. Visual fields are full.  Motor: The motor testing reveals 5 over 5 strength of all 4 extremities. Good symmetric motor tone is noted throughout.  Sensory: Sensory testing is intact to pinprick, soft touch, vibration sensation, and position sense on all 4 extremities, with the exception of some decreased pinprick sensation on the lateral aspect of the right leg below the knee, and decreased position sense of the right foot. No evidence of extinction is noted.  Coordination: Cerebellar testing reveals good finger-nose-finger and heel-to-shin bilaterally.  Gait and station: Gait is normal. Tandem gait is slightly unsteady. Romberg is negative. No drift is  seen.  Reflexes: Deep tendon reflexes are symmetric and normal bilaterally. Toes are downgoing bilaterally.   Assessment/Plan:  One. Cerebrovascular disease   2. Fibromyalgia  3. Dyslipidemia  The patient is doing well at this point. The patient will be switched from Aggrenox to Plavix. If the patient has any further issues, she is to contact our office. Otherwise, the patient will followup if needed.  Marlan Palau MD 04/20/2013 7:05 PM  [facility] [address] [place], [place] [address]  Phone [phone] Fax [phone]

## 2013-04-22 DIAGNOSIS — Z79899 Other long term (current) drug therapy: Secondary | ICD-10-CM | POA: Diagnosis not present

## 2013-04-25 ENCOUNTER — Ambulatory Visit (AMBULATORY_SURGERY_CENTER): Payer: Medicare Other | Admitting: Internal Medicine

## 2013-04-25 ENCOUNTER — Encounter: Payer: Self-pay | Admitting: Internal Medicine

## 2013-04-25 ENCOUNTER — Telehealth: Payer: Self-pay | Admitting: Internal Medicine

## 2013-04-25 VITALS — BP 130/90 | HR 65 | Temp 97.7°F | Resp 20

## 2013-04-25 DIAGNOSIS — K635 Polyp of colon: Secondary | ICD-10-CM

## 2013-04-25 DIAGNOSIS — D126 Benign neoplasm of colon, unspecified: Secondary | ICD-10-CM

## 2013-04-25 DIAGNOSIS — R159 Full incontinence of feces: Secondary | ICD-10-CM

## 2013-04-25 DIAGNOSIS — Z8673 Personal history of transient ischemic attack (TIA), and cerebral infarction without residual deficits: Secondary | ICD-10-CM | POA: Diagnosis not present

## 2013-04-25 MED ORDER — SODIUM CHLORIDE 0.9 % IV SOLN: 500.0000 mL | INTRAVENOUS | Status: DC

## 2013-04-25 NOTE — Progress Notes (Signed)
Called to room to assist during endoscopic procedure.  Patient ID and intended procedure confirmed with present staff. Received instructions for my participation in the procedure from the performing physician.  

## 2013-04-25 NOTE — Progress Notes (Signed)
Procedure ends, to recovery, report given and VSS. 

## 2013-04-25 NOTE — Progress Notes (Signed)
Per pt her last day pf aggrenox was Friday, April 22, 2013.  She said she will finish aggrenox and start Plavix next. Maw

## 2013-04-25 NOTE — Progress Notes (Signed)
Patient did not experience any of the following events: a burn prior to discharge; a fall within the facility; wrong site/side/patient/procedure/implant event; or a hospital transfer or hospital admission upon discharge from the facility. (G8907) Patient did not have preoperative order for IV antibiotic SSI prophylaxis. (G8918)  

## 2013-04-25 NOTE — Op Note (Signed)
[facility] [address] [place], [address]   FLEXIBLE SIGMOIDOSCOPY PROCEDURE REPORT  PATIENT: Nicole Perkins, Nicole Perkins  MR#: [id number] BIRTHDATE: 1945/12/26 , 66  yrs. old GENDER: Female ENDOSCOPIST: Beverley Fiedler, MD REFERRED BY: Ruthe Mannan, M.D. PROCEDURE DATE:  04/25/2013 PROCEDURE:   Sigmoidoscopy with biopsy ASA CLASS:   Class III INDICATIONS:fecal incontinence. MEDICATIONS: MAC sedation, administered by CRNA and propofol (Diprivan) 200mg  IV  DESCRIPTION OF PROCEDURE:   After the risks benefits and alternatives of the procedure were thoroughly explained, informed consent was obtained.  revealed decreased sphincter tone. The LB [id number]  endoscope was introduced through the anus  and advanced to the mid-sigmoid colon , limited by No adverse events experienced.   The quality of the prep was adequate .  The instrument was then slowly withdrawn as the mucosa was fully examined.    COLON FINDINGS: Mild diverticulosis was noted in the sigmoid colon. A single polyp measuring 3 mm in size was found in the rectum.  A polypectomy was performed with cold forceps.  The resection was complete and the polyp tissue was completely retrieved. The colon mucosa was otherwise normal in the distal sigmoid colon and rectum.  Retroflexed views revealed no abnormalities.    The scope was then withdrawn from the patient and the procedure terminated.  COMPLICATIONS: There were no complications.  ENDOSCOPIC IMPRESSION: 1.   Mild diverticulosis was noted in the sigmoid colon 2.   Single polyp measuring 3 mm in size was found in the rectum; polypectomy was performed with cold forceps 3.   The colon mucosa was otherwise normal  RECOMMENDATIONS: 1.  Await biopsy results 2.  Proceed to anorectal manometry with Dr.  Maisie Fus  [ eSigned:  Beverley Fiedler, MD 04/25/2013 4:41 PM  EX:BMWU, Jovita Gamma MD The Patient

## 2013-04-25 NOTE — Patient Instructions (Addendum)

## 2013-04-25 NOTE — Telephone Encounter (Signed)
Returned pt's call after Toniann Fail from 3rd floor called & said pt had called back upset because no one had returned her call. Pt . Concerned because she had done an enema approx. One hour ago and had not had a BM since doing enema . When questioned pt as to what other prep she had done she said she did Magnesium Citrate last night with good BM results after that and that her last BM was liquid; however concerned that no BM after enema because enema box says if you have not had results within 30 min after doing enema there could be a medical problem. Explained to pt she is probably already empty from the mag citrate therefore nothing left in her to expel and explained that the enema instructions do not take into consideration that you have already done another type of bowel prep . Pt did admit expelling the liquid from the enema. Told pt I felt like she was fine to come on in to have her flex sigmoidoscopy.

## 2013-04-26 ENCOUNTER — Telehealth: Payer: Self-pay | Admitting: *Deleted

## 2013-04-26 NOTE — Telephone Encounter (Signed)
  Follow up Call-  Call back number 04/25/2013  Post procedure Call Back phone  # [phone]  Permission to leave phone message Yes     Patient questions:  Do you have a fever, pain , or abdominal swelling? no Pain Score  0 *  Have you tolerated food without any problems? yes  Have you been able to return to your normal activities? yes  Do you have any questions about your discharge instructions: Diet   no Medications  no Follow up visit  no  Do you have questions or concerns about your Care? no  Actions: * If pain score is 4 or above: No action needed, pain <4.

## 2013-05-02 ENCOUNTER — Encounter: Payer: Self-pay | Admitting: Family Medicine

## 2013-05-04 ENCOUNTER — Encounter: Payer: Self-pay | Admitting: Internal Medicine

## 2013-05-11 ENCOUNTER — Encounter: Payer: Medicare Other | Admitting: Internal Medicine

## 2013-05-13 ENCOUNTER — Telehealth (INDEPENDENT_AMBULATORY_CARE_PROVIDER_SITE_OTHER): Payer: Self-pay | Admitting: *Deleted

## 2013-05-13 NOTE — Telephone Encounter (Signed)
The day before is fine.

## 2013-05-13 NOTE — Telephone Encounter (Signed)
Patient called in to ask about stopping her blood thinner medication prior to Monday's procedure.  Patient states she thinks she was told just the day before but just wanted to verify it didn't need to be any earlier than that.  Explained that a message would be sent and as soon as we have an answer back we will let her know.  Patient states understanding and agreeable at this time.

## 2013-05-13 NOTE — Telephone Encounter (Signed)
I notified the pt to stop Plavix one day before.

## 2013-05-16 ENCOUNTER — Encounter (HOSPITAL_COMMUNITY): Payer: Self-pay | Admitting: *Deleted

## 2013-05-16 ENCOUNTER — Encounter (HOSPITAL_COMMUNITY)
Admission: RE | Disposition: A | Discharge status: Home / Self Care | Payer: Self-pay | Source: Ambulatory Visit | Attending: General Surgery

## 2013-05-16 ENCOUNTER — Ambulatory Visit (HOSPITAL_COMMUNITY)
Admission: RE | Admit: 2013-05-16 | Discharge: 2013-05-16 | Disposition: A | Discharge status: Home / Self Care | Payer: Medicare Other | Source: Ambulatory Visit | Attending: General Surgery | Admitting: General Surgery

## 2013-05-16 DIAGNOSIS — IMO0001 Reserved for inherently not codable concepts without codable children: Secondary | ICD-10-CM | POA: Diagnosis not present

## 2013-05-16 DIAGNOSIS — M810 Age-related osteoporosis without current pathological fracture: Secondary | ICD-10-CM | POA: Insufficient documentation

## 2013-05-16 DIAGNOSIS — K6289 Other specified diseases of anus and rectum: Secondary | ICD-10-CM | POA: Diagnosis not present

## 2013-05-16 DIAGNOSIS — E785 Hyperlipidemia, unspecified: Secondary | ICD-10-CM | POA: Diagnosis not present

## 2013-05-16 DIAGNOSIS — Z8673 Personal history of transient ischemic attack (TIA), and cerebral infarction without residual deficits: Secondary | ICD-10-CM | POA: Diagnosis not present

## 2013-05-16 DIAGNOSIS — R159 Full incontinence of feces: Secondary | ICD-10-CM | POA: Diagnosis not present

## 2013-05-16 DIAGNOSIS — Z79899 Other long term (current) drug therapy: Secondary | ICD-10-CM | POA: Diagnosis not present

## 2013-05-16 DIAGNOSIS — K219 Gastro-esophageal reflux disease without esophagitis: Secondary | ICD-10-CM | POA: Diagnosis not present

## 2013-05-16 HISTORY — PX: ANAL RECTAL MANOMETRY: SHX6358

## 2013-05-16 HISTORY — PX: RECTAL ULTRASOUND: SHX2306

## 2013-05-16 SURGERY — MANOMETRY, ANORECTAL
Anesthesia: Topical

## 2013-05-16 SURGERY — US RECTUM
Wound class: Clean Contaminated

## 2013-05-16 MED ORDER — SODIUM CHLORIDE 0.9 % IV SOLN: INTRAVENOUS | Status: DC

## 2013-05-16 NOTE — Op Note (Addendum)
05/16/2013  3:16 PM  PATIENT:  Nicole Perkins  67 y.o. female  Patient Care Team: Dianne Dun, MD as PCP - General  PRE-OPERATIVE DIAGNOSIS:  fecal incontinance  POST-OPERATIVE DIAGNOSIS:  Fecal incontinence   PROCEDURE:  Anal ULTRASOUND   SURGEON:  Surgeon(s): Romie Levee, MD  ANESTHESIA:   none  EBL: none    PLAN OF CARE: Discharge to home after PACU   INDICATION: fecal incontinence   OR FINDINGS: anterior thinning of external and internal sphicters  DESCRIPTION: The patient was identified in the preoperative holding area and taken to the Endoscopy room where she laid in latera decubitus position on the table.  A timeout was performed indicating the correct patient, procedure and positioning. I began with a digital rectal exam.  There were no abnormalities.  I then placed the ultrasound probe into the anal canal.  The levators were identified.  I slowly removed the probe and identified the internal and external sphincters.  There was thinning of the muscles anteriorly with a 30-40 degree defect noted.  The probe was removed.  The patient tolerated the procedure well.  There were 2 printed pictures of the sphincters that will be scanned into the patient's chart.

## 2013-05-16 NOTE — H&P (Signed)
Chief Complaint   Patient presents with   .  New Evaluation     fecal incontinence   HISTORY: Nicole Perkins is a 67 y.o. female who presents to the office with fecal incontinence. This had been worsening that last 6 mo. Diarrhea makes the symptoms worse. It is intermittent in nature. She takes imodium, which temporarily helps. When she doesn't take imodium she has leakage ~2 times a day. Her bowel habits are regular and her bowel movements shift from diarrhea to soft stools. Her fiber intake is dietary. She has tried metamucil powder for 6 weeks, but this seemed to make seem worse. Her last colonoscopy was normal per pt report. She has had 3 relatively easy vaginal deliveries. She is incontinent to gas and soft/liquid stool. She rarely has a formed BM leak out. She denies any recent changes in her medication.  Past Medical History   Diagnosis  Date   .  Mood disorder      NOS   .  Fibromyalgia    .  GERD (gastroesophageal reflux disease)    .  Hyperlipidemia    .  IBS (irritable bowel syndrome)    .  History of colonic polyps    .  History of stroke      ministroke x3   .  Imbalance  11/2012     s/p PT at [facility]   .  Arthritis    .  Osteoporosis    .  Stroke     Past Surgical History   Procedure  Laterality  Date   .  Thyroidectomy   2007     partial   .  Cosmetic surgery     .  Cholecystectomy   1995   .  Thumb arthroscopy       left    Current Outpatient Prescriptions   Medication  Sig  Dispense  Refill   .  alendronate (FOSAMAX) 70 MG tablet  Take 1 tablet (70 mg total) by mouth every 7 (seven) days. Take with a full glass of water on an empty stomach.  12 tablet  3   .  BIOTIN 5000 PO  Take one by mouth daily     .  calcium carbonate 200 MG capsule  Take 250 mg by mouth 2 (two) times daily with a meal.     .  Cholecalciferol (VITAMIN D3) 2000 UNITS capsule  Take by mouth. 5 tab by mouth daily     .  dipyridamole-aspirin (AGGRENOX) 200-25 MG per 12 hr capsule  Take 1  capsule by mouth daily.  90 capsule  3   .  escitalopram (LEXAPRO) 20 MG tablet  Take 1 tablet (20 mg total) by mouth daily.  90 tablet  3   .  hyoscyamine (LEVSIN/SL) 0.125 MG SL tablet  Place 1 tablet (0.125 mg total) under the tongue 3 (three) times daily.  90 tablet  1   .  lamoTRIgine (LAMICTAL) 200 MG tablet  TAKE 1 TABLET BY MOUTH DAILY  30 tablet  3   .  loperamide (IMODIUM A-D) 2 MG tablet  Take 1 tablet (2 mg total) by mouth 4 (four) times daily as needed for diarrhea or loose stools.  30 tablet  0   .  niacin (NIASPAN) 1000 MG CR tablet  Take 1 tablet (1,000 mg total) by mouth at bedtime.  90 tablet  3   .  omega-3 acid ethyl esters (LOVAZA) 1 G capsule  Take by mouth.  2 cap by mouth 2 times a day     .  omeprazole (PRILOSEC) 40 MG capsule  Take 1 capsule (40 mg total) by mouth 2 (two) times daily.  180 capsule  3   .  peg 3350 powder (MOVIPREP) 100 G SOLR  Take 1 kit (100 g total) by mouth once.  1 kit  0   .  simvastatin (ZOCOR) 40 MG tablet  Take 1 tablet (40 mg total) by mouth daily.  90 tablet  1   .  traZODone (DESYREL) 100 MG tablet  Take 2 by mouth at bedtime  180 tablet  3   .  gabapentin (NEURONTIN) 300 MG capsule  Take 1 capsule (300 mg total) by mouth 2 (two) times daily.  180 capsule  0   .  methylphenidate (RITALIN) 10 MG tablet  Take 1 tablet (10 mg total) by mouth 3 (three) times daily.  90 tablet  0    No current facility-administered medications for this visit.   No Known Allergies  Family History   Problem  Relation  Age of Onset   .  Stroke  Mother    .  Stroke  Father    .  Heart disease  Brother    .  Cancer  Brother     History    Social History   .  Marital Status:  Single     Spouse Name:  N/A     Number of Children:  1   .  Years of Education:  N/A    Occupational History   .  Retired Child psychotherapist     Social History Main Topics   .  Smoking status:  Former Smoker     Types:  Cigarettes     Quit date:  10/06/1974   .  Smokeless tobacco:   Never Used   .  Alcohol Use:  Yes      Comment: Rare   .  Drug Use:  No   .  Sexually Active:  None    Other Topics  Concern   .  None    Social History Narrative   .  None   REVIEW OF SYSTEMS - PERTINENT POSITIVES ONLY:  Review of Systems - General ROS: negative for - chills, fever or weight loss  Hematological and Lymphatic ROS: negative for - bleeding problems, blood clots or bruising  Respiratory ROS: no cough, shortness of breath, or wheezing  Cardiovascular ROS: no chest pain or dyspnea on exertion  Gastrointestinal ROS: positive for - abdominal pain  negative for - nausea/vomiting  Genito-Urinary ROS: no dysuria, trouble voiding, or hematuria  EXAM:  Filed Vitals:   05/16/13 1428  BP: 123/60  Temp: 98.2 F (36.8 C)  Resp: 16   General appearance: alert and cooperative  Resp: clear to auscultation bilaterally  Cardio: regular rate and rhythm  GI: soft, non-tender; bowel sounds normal; no masses, no organomegaly  Previous Anoscopy Findings: Moderate resting tone, minimal squeeze pressure, possible anterior sphincter defect, good push. Minimal hemorrhoid disease   ASSESSMENT AND PLAN:  Nicole Perkins is a 67 y.o. F with fecal incontinence that his gotten worse over the last 6 months. She is scheduled for endoscopic evaluation with Dr Rhea Belton shortly. Currently she is doing better on anti-motility agents. I have recommended that she undergo manometry studies and an anal [place] to evaluate her sphincters. She has tried fiber therapy in the past for sufficient amounts of time, and this did not help.  I think she may be a good candidate for sacral nerve stimulation if we cannot find a more reversible cause for her symptoms with these upcoming tests.  Vanita Panda, MD  Colon and Rectal Surgery / General Surgery  [facility]  Visit Diagnoses:  1.  Fecal incontinence

## 2013-05-18 ENCOUNTER — Encounter (HOSPITAL_COMMUNITY): Payer: Self-pay | Admitting: General Surgery

## 2013-05-18 DIAGNOSIS — M25519 Pain in unspecified shoulder: Secondary | ICD-10-CM | POA: Diagnosis not present

## 2013-05-18 DIAGNOSIS — M5412 Radiculopathy, cervical region: Secondary | ICD-10-CM | POA: Diagnosis not present

## 2013-05-18 DIAGNOSIS — M542 Cervicalgia: Secondary | ICD-10-CM | POA: Diagnosis not present

## 2013-05-18 DIAGNOSIS — Z043 Encounter for examination and observation following other accident: Secondary | ICD-10-CM | POA: Diagnosis not present

## 2013-05-18 DIAGNOSIS — Z8673 Personal history of transient ischemic attack (TIA), and cerebral infarction without residual deficits: Secondary | ICD-10-CM | POA: Diagnosis not present

## 2013-05-18 DIAGNOSIS — M19019 Primary osteoarthritis, unspecified shoulder: Secondary | ICD-10-CM | POA: Diagnosis not present

## 2013-05-18 DIAGNOSIS — S4980XA Other specified injuries of shoulder and upper arm, unspecified arm, initial encounter: Secondary | ICD-10-CM | POA: Diagnosis not present

## 2013-05-18 DIAGNOSIS — E785 Hyperlipidemia, unspecified: Secondary | ICD-10-CM | POA: Diagnosis not present

## 2013-05-18 DIAGNOSIS — M25819 Other specified joint disorders, unspecified shoulder: Secondary | ICD-10-CM | POA: Diagnosis not present

## 2013-05-18 DIAGNOSIS — F329 Major depressive disorder, single episode, unspecified: Secondary | ICD-10-CM | POA: Diagnosis not present

## 2013-05-18 DIAGNOSIS — M8569 Other cyst of bone, multiple sites: Secondary | ICD-10-CM | POA: Diagnosis not present

## 2013-05-19 DIAGNOSIS — M8569 Other cyst of bone, multiple sites: Secondary | ICD-10-CM | POA: Diagnosis not present

## 2013-05-19 DIAGNOSIS — S4980XA Other specified injuries of shoulder and upper arm, unspecified arm, initial encounter: Secondary | ICD-10-CM | POA: Diagnosis not present

## 2013-05-19 DIAGNOSIS — M25819 Other specified joint disorders, unspecified shoulder: Secondary | ICD-10-CM | POA: Diagnosis not present

## 2013-05-20 ENCOUNTER — Telehealth: Payer: Self-pay | Admitting: Family Medicine
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
## 2013-05-20 NOTE — Telephone Encounter (Signed)
Yes ok to accomodate- use two 15 min appts if possible.

## 2013-05-20 NOTE — Telephone Encounter (Signed)
You don't have 2- 15 minute slots together.  Your schedule is full until Tuesday.  Do you want to open a slot or should I schedule patient on Tuesday?

## 2013-05-20 NOTE — Telephone Encounter (Signed)
Ok to open 11:30.

## 2013-05-23 ENCOUNTER — Ambulatory Visit (INDEPENDENT_AMBULATORY_CARE_PROVIDER_SITE_OTHER): Payer: Medicare Other | Admitting: Family Medicine

## 2013-05-23 ENCOUNTER — Encounter: Payer: Self-pay | Admitting: Family Medicine

## 2013-05-23 DIAGNOSIS — M25519 Pain in unspecified shoulder: Secondary | ICD-10-CM | POA: Diagnosis not present

## 2013-05-23 DIAGNOSIS — M25512 Pain in left shoulder: Secondary | ICD-10-CM | POA: Insufficient documentation
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
## 2013-05-23 NOTE — Patient Instructions (Addendum)
Good to see you. Please stop by to see Shirlee Limerick on your way out to set up your referral for MRI.

## 2013-05-26 DIAGNOSIS — S43439A Superior glenoid labrum lesion of unspecified shoulder, initial encounter: Secondary | ICD-10-CM | POA: Diagnosis not present

## 2013-05-26 DIAGNOSIS — S43499A Other sprain of unspecified shoulder joint, initial encounter: Secondary | ICD-10-CM | POA: Diagnosis not present

## 2013-05-26 DIAGNOSIS — M25519 Pain in unspecified shoulder: Secondary | ICD-10-CM | POA: Diagnosis not present

## 2013-05-26 DIAGNOSIS — M67919 Unspecified disorder of synovium and tendon, unspecified shoulder: Secondary | ICD-10-CM | POA: Diagnosis not present

## 2013-05-26 DIAGNOSIS — M659 Synovitis and tenosynovitis, unspecified: Secondary | ICD-10-CM | POA: Diagnosis not present

## 2013-05-27 ENCOUNTER — Other Ambulatory Visit: Payer: Self-pay | Admitting: Family Medicine

## 2013-05-27 ENCOUNTER — Encounter: Payer: Self-pay | Admitting: Family Medicine

## 2013-05-27 DIAGNOSIS — M75110 Incomplete rotator cuff tear or rupture of unspecified shoulder, not specified as traumatic: Secondary | ICD-10-CM | POA: Insufficient documentation

## 2013-05-27 DIAGNOSIS — M75112 Incomplete rotator cuff tear or rupture of left shoulder, not specified as traumatic: Secondary | ICD-10-CM

## 2013-06-01 DIAGNOSIS — M25519 Pain in unspecified shoulder: Secondary | ICD-10-CM | POA: Diagnosis not present

## 2013-06-07 DIAGNOSIS — M19019 Primary osteoarthritis, unspecified shoulder: Secondary | ICD-10-CM | POA: Diagnosis not present

## 2013-06-13 ENCOUNTER — Other Ambulatory Visit: Payer: Self-pay | Admitting: Family Medicine

## 2013-06-15 DIAGNOSIS — R002 Palpitations: Secondary | ICD-10-CM | POA: Diagnosis not present

## 2013-06-15 DIAGNOSIS — R55 Syncope and collapse: Secondary | ICD-10-CM | POA: Diagnosis not present

## 2013-06-15 DIAGNOSIS — R112 Nausea with vomiting, unspecified: Secondary | ICD-10-CM | POA: Diagnosis not present

## 2013-06-15 DIAGNOSIS — R51 Headache: Secondary | ICD-10-CM | POA: Diagnosis not present

## 2013-06-15 DIAGNOSIS — Z91040 Latex allergy status: Secondary | ICD-10-CM | POA: Diagnosis not present

## 2013-06-15 DIAGNOSIS — R42 Dizziness and giddiness: Secondary | ICD-10-CM | POA: Diagnosis not present

## 2013-06-15 DIAGNOSIS — Z87891 Personal history of nicotine dependence: Secondary | ICD-10-CM | POA: Diagnosis not present

## 2013-06-15 DIAGNOSIS — F329 Major depressive disorder, single episode, unspecified: Secondary | ICD-10-CM | POA: Diagnosis not present

## 2013-06-15 DIAGNOSIS — E785 Hyperlipidemia, unspecified: Secondary | ICD-10-CM | POA: Diagnosis not present

## 2013-06-15 DIAGNOSIS — Z8673 Personal history of transient ischemic attack (TIA), and cerebral infarction without residual deficits: Secondary | ICD-10-CM | POA: Diagnosis not present

## 2013-06-15 DIAGNOSIS — R109 Unspecified abdominal pain: Secondary | ICD-10-CM | POA: Diagnosis not present

## 2013-06-15 DIAGNOSIS — M25519 Pain in unspecified shoulder: Secondary | ICD-10-CM | POA: Diagnosis not present

## 2013-06-15 DIAGNOSIS — R000 Tachycardia, unspecified: Secondary | ICD-10-CM | POA: Diagnosis not present

## 2013-06-16 ENCOUNTER — Telehealth: Payer: Self-pay | Admitting: Family Medicine

## 2013-06-16 DIAGNOSIS — G459 Transient cerebral ischemic attack, unspecified: Secondary | ICD-10-CM | POA: Diagnosis not present

## 2013-06-16 DIAGNOSIS — R002 Palpitations: Secondary | ICD-10-CM | POA: Diagnosis not present

## 2013-06-16 DIAGNOSIS — E785 Hyperlipidemia, unspecified: Secondary | ICD-10-CM | POA: Diagnosis not present

## 2013-06-16 NOTE — Telephone Encounter (Signed)
Patient recently discharged from [facility]. Please schedule hospital follow up.

## 2013-06-17 NOTE — Telephone Encounter (Signed)
Left vm for pt to return call  

## 2013-06-20 DIAGNOSIS — M25519 Pain in unspecified shoulder: Secondary | ICD-10-CM | POA: Diagnosis not present

## 2013-06-20 NOTE — Telephone Encounter (Signed)
Appt scheduled for 9/18. 

## 2013-06-23 ENCOUNTER — Encounter: Payer: Self-pay | Admitting: Family Medicine

## 2013-06-23 ENCOUNTER — Ambulatory Visit (INDEPENDENT_AMBULATORY_CARE_PROVIDER_SITE_OTHER): Payer: Medicare Other | Admitting: Family Medicine

## 2013-06-23 VITALS — BP 140/64 | HR 88 | Temp 98.5°F | Wt 171.0 lb

## 2013-06-23 DIAGNOSIS — G459 Transient cerebral ischemic attack, unspecified: Secondary | ICD-10-CM

## 2013-06-23 DIAGNOSIS — Z1231 Encounter for screening mammogram for malignant neoplasm of breast: Secondary | ICD-10-CM | POA: Diagnosis not present

## 2013-06-23 DIAGNOSIS — F411 Generalized anxiety disorder: Secondary | ICD-10-CM

## 2013-06-23 DIAGNOSIS — Z23 Encounter for immunization: Secondary | ICD-10-CM

## 2013-06-23 DIAGNOSIS — R55 Syncope and collapse: Secondary | ICD-10-CM | POA: Diagnosis not present

## 2013-06-23 DIAGNOSIS — M25519 Pain in unspecified shoulder: Secondary | ICD-10-CM | POA: Diagnosis not present

## 2013-06-23 NOTE — Patient Instructions (Addendum)
Good to see you. We will call you with your cardiology appointment.  Please keep me updated.
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
## 2013-06-24 DIAGNOSIS — R159 Full incontinence of feces: Secondary | ICD-10-CM | POA: Diagnosis not present

## 2013-06-25 ENCOUNTER — Other Ambulatory Visit: Payer: Self-pay | Admitting: Family Medicine

## 2013-06-27 DIAGNOSIS — M25519 Pain in unspecified shoulder: Secondary | ICD-10-CM | POA: Diagnosis not present

## 2013-06-28 DIAGNOSIS — H348192 Central retinal vein occlusion, unspecified eye, stable: Secondary | ICD-10-CM | POA: Diagnosis not present

## 2013-06-28 DIAGNOSIS — H359 Unspecified retinal disorder: Secondary | ICD-10-CM | POA: Diagnosis not present

## 2013-06-28 DIAGNOSIS — H33329 Round hole, unspecified eye: Secondary | ICD-10-CM | POA: Diagnosis not present

## 2013-06-28 DIAGNOSIS — H35359 Cystoid macular degeneration, unspecified eye: Secondary | ICD-10-CM | POA: Diagnosis not present

## 2013-06-28 DIAGNOSIS — H43819 Vitreous degeneration, unspecified eye: Secondary | ICD-10-CM | POA: Diagnosis not present

## 2013-06-30 DIAGNOSIS — M25519 Pain in unspecified shoulder: Secondary | ICD-10-CM | POA: Diagnosis not present

## 2013-07-04 ENCOUNTER — Ambulatory Visit (INDEPENDENT_AMBULATORY_CARE_PROVIDER_SITE_OTHER): Payer: Medicare Other | Admitting: Cardiovascular Disease

## 2013-07-04 ENCOUNTER — Encounter: Payer: Self-pay | Admitting: Cardiovascular Disease

## 2013-07-04 VITALS — BP 90/60 | HR 70 | Ht 64.5 in | Wt 169.2 lb

## 2013-07-04 DIAGNOSIS — R000 Tachycardia, unspecified: Secondary | ICD-10-CM

## 2013-07-04 DIAGNOSIS — R42 Dizziness and giddiness: Secondary | ICD-10-CM | POA: Diagnosis not present

## 2013-07-04 DIAGNOSIS — G459 Transient cerebral ischemic attack, unspecified: Secondary | ICD-10-CM | POA: Diagnosis not present

## 2013-07-04 DIAGNOSIS — E785 Hyperlipidemia, unspecified: Secondary | ICD-10-CM | POA: Diagnosis not present

## 2013-07-04 MED ORDER — FLUDROCORTISONE ACETATE 0.1 MG PO TABS: 0.1000 mg | ORAL_TABLET | Freq: Every day | ORAL | Status: DC

## 2013-07-04 NOTE — Assessment & Plan Note (Signed)
Prior TIA symptoms not clear. Workup was negative. Recommended she stay on Plavix

## 2013-07-04 NOTE — Assessment & Plan Note (Addendum)
Long discussion today about her episodes, workup in the hospital and possible workup that we can do.   Etiology of her recent episodes is unclear blood pressures running low raising the concern of vagal episodes or orthostatic hypotension. We have suggested she closely monitor her blood pressure at home. If blood pressures do run in the 90, 100 range as she details, she could add Florinef every other day.  Also discussed arrhythmia. She will buy a blood pressure cuff, check her pulse rate when she has episodes. She will call us if she continues to have symptoms and would wear a 30 day monitor. She does not think that this would catch her meds as they are infrequent.

## 2013-07-04 NOTE — Patient Instructions (Addendum)
Consider wearing compression hose Increase your fluid intake and salt intake Start monitoring your blood pressure If blood pressure runs low, consider starting florinef 1 pill every other day  Please call us if you have new issues that need to be addressed before your next appt.  Your physician wants you to follow-up in: 3 months.  You will receive a reminder letter in the mail two months in advance. If you don't receive a letter, please call our office to schedule the follow-up appointment.

## 2013-07-04 NOTE — Assessment & Plan Note (Signed)
Suggested she stay on her simvastatin 

## 2013-07-04 NOTE — Progress Notes (Signed)
Patient ID: Nicole Perkins, female    DOB: 1946/09/01, 67 y.o.   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  She reports having several recent episodes of feeling "weird in her head". Episode possibly 6 months ago but details are unclear. She was opening a blind while at home, felt that she was in slow motion with a feeling that she is going to pass out. She fell backwards and bruised herself.   She remembers an episode June 15 2013. She was at a grocery store, felt a strange feeling in her head. Did not describe it as a spinning. She had headache, stomach pain. Some nausea, heart pounding, tachycardia. She was worried that she was going to pass out. She looked around the store for some fatigue.she went to [facility] and was kept overnight. Testing was reportedly normal though details are not available . CT scan of the head was not performed . She is concerned as she seemed to have 2 episodes within 5 days. Details of the other episode or not as severe, she does not remember what happened She does report that her blood pressures run low, typically in the 100 range   EKG shows normal sinus rhythm with rate 62 beats per minute with no significant ST or T wave changes  Echocardiogram 2 years ago was essentially normal. Saline contrast bubble study was not performed. Transesophageal echo was recommended if clinically indicated given her recent stroke. She was started on Aggrenox. MRA did not show significant carotid arterial disease.  EKG shows normal sinus rhythm with rate 70 beats per minute, no significant ST or T wave changes   Outpatient Encounter Prescriptions as of 07/04/2013  Medication Sig Dispense Refill  . alendronate (FOSAMAX) 70 MG tablet Take 1  tablet (70 mg total) by mouth every 7 (seven) days. Take with a full glass of water on an empty stomach.  12 tablet  3  . BIOTIN 5000 PO Take one by mouth daily      . calcium carbonate 200 MG capsule Take 250 mg by mouth 2 (two) times daily with a meal.      . Cholecalciferol (VITAMIN D3) 2000 UNITS capsule Take 2,000 Units by mouth daily.       . clopidogrel (PLAVIX) 75 MG tablet Take 1 tablet (75 mg total) by mouth daily.  30 tablet  11  . escitalopram (LEXAPRO) 20 MG tablet Take 1 tablet (20 mg total) by mouth daily.  90 tablet  3  . gabapentin (NEURONTIN) 300 MG capsule Take 1 capsule (300 mg total) by mouth 2 (two) times daily.  180 capsule  0  . Krill Oil 1000 MG CAPS Take 1,000 mg by mouth daily.      Marland Kitchen lamoTRIgine (LAMICTAL) 200 MG tablet TAKE 1 TABLET BY MOUTH DAILY  30 tablet  3  . loperamide (IMODIUM A-D) 2 MG tablet Take 1 tablet (2 mg total) by mouth 4 (four) times daily as needed for diarrhea or loose stools.  30 tablet  0  . methylphenidate (RITALIN) 10 MG tablet Take 1 tablet (10 mg total) by mouth 3 (three) times daily.  90 tablet  0  . niacin (NIASPAN) 1000 MG CR tablet Take 1 tablet (1,000 mg total) by mouth at bedtime.  90 tablet  3  . omeprazole (PRILOSEC) 40 MG capsule Take 1 capsule (40 mg total) by mouth 2 (two) times daily.  180 capsule  3  . simvastatin (ZOCOR) 40 MG tablet TAKE 1 TABLET (40 MG TOTAL) BY MOUTH DAILY.  90 tablet  1  . traZODone (DESYREL) 100 MG tablet TAKE 2 TABLETS AT BEDTIME  180 tablet  3   No facility-administered encounter medications on file as of 07/04/2013.     Review of Systems  Constitutional: Negative.   HENT: Negative.   Eyes: Negative.   Respiratory: Negative.   Cardiovascular: Negative.   Gastrointestinal: Negative.   Endocrine: Negative.   Musculoskeletal: Negative.   Skin: Negative.   Allergic/Immunologic: Negative.   Neurological: Positive for light-headedness.  Hematological: Negative.   Psychiatric/Behavioral: Negative.    All other systems reviewed and are negative.    BP 90/60  Pulse 70  Ht 5' 4.5" (1.638 m)  Wt 169 lb 4 oz (76.771 kg)  BMI 28.61 kg/m2 Repeat blood pressure was 105/60 bilaterally Physical Exam  Nursing note and vitals reviewed. Constitutional: She is oriented to person, place, and time. She appears well-developed and well-nourished.  HENT:  Head: Normocephalic.  Nose: Nose normal.  Mouth/Throat: Oropharynx is clear and moist.  Eyes: Conjunctivae are normal. Pupils are equal, round, and reactive to light.  Neck: Normal range of motion. Neck supple. No JVD present.  Cardiovascular: Normal rate, regular rhythm, S1 normal, S2 normal, normal heart sounds and intact distal pulses.  Exam reveals no gallop and no friction rub.   No murmur heard. Pulmonary/Chest: Effort normal and breath sounds normal. No respiratory distress. She has no wheezes. She has no rales. She exhibits no tenderness.  Abdominal: Soft. Bowel sounds are normal. She exhibits no distension. There is no tenderness.  Musculoskeletal: Normal range of motion. She exhibits no edema and no tenderness.  Lymphadenopathy:    She has no cervical adenopathy.  Neurological: She is alert and oriented to person, place, and time. Coordination normal.  Skin: Skin is warm and dry. No rash noted. No erythema.  Psychiatric: She has a normal mood and affect. Her behavior is normal. Judgment and thought content normal.    Assessment and Plan

## 2013-07-11 DIAGNOSIS — Z1231 Encounter for screening mammogram for malignant neoplasm of breast: Secondary | ICD-10-CM | POA: Diagnosis not present

## 2013-07-11 DIAGNOSIS — R922 Inconclusive mammogram: Secondary | ICD-10-CM | POA: Diagnosis not present

## 2013-07-11 LAB — HM MAMMOGRAPHY: HM Mammogram: NEGATIVE

## 2013-07-14 ENCOUNTER — Encounter: Payer: Self-pay | Admitting: Family Medicine

## 2013-07-27 ENCOUNTER — Encounter: Payer: Self-pay | Admitting: *Deleted

## 2013-07-28 ENCOUNTER — Encounter: Payer: Self-pay | Admitting: *Deleted

## 2013-07-28 DIAGNOSIS — M25519 Pain in unspecified shoulder: Secondary | ICD-10-CM | POA: Diagnosis not present

## 2013-08-04 DIAGNOSIS — M25519 Pain in unspecified shoulder: Secondary | ICD-10-CM | POA: Diagnosis not present

## 2013-08-08 DIAGNOSIS — M19019 Primary osteoarthritis, unspecified shoulder: Secondary | ICD-10-CM | POA: Diagnosis not present

## 2013-08-11 ENCOUNTER — Encounter: Payer: Self-pay | Admitting: Cardiovascular Disease

## 2013-08-17 ENCOUNTER — Other Ambulatory Visit: Payer: Self-pay | Admitting: *Deleted

## 2013-08-17 MED ORDER — ESCITALOPRAM OXALATE 20 MG PO TABS: 20.0000 mg | ORAL_TABLET | Freq: Every day | ORAL | Status: DC

## 2013-08-17 MED ORDER — OMEPRAZOLE 40 MG PO CPDR: 40.0000 mg | DELAYED_RELEASE_CAPSULE | Freq: Two times a day (BID) | ORAL | Status: DC

## 2013-09-12 ENCOUNTER — Other Ambulatory Visit: Payer: Self-pay | Admitting: *Deleted

## 2013-09-12 ENCOUNTER — Telehealth: Payer: Self-pay | Admitting: Family Medicine

## 2013-09-12 MED ORDER — METHYLPHENIDATE HCL 10 MG PO TABS: 10.0000 mg | ORAL_TABLET | Freq: Three times a day (TID) | ORAL | Status: DC

## 2013-09-12 NOTE — Telephone Encounter (Signed)
Pharmacist request we call pt when rx is ready to pick up.

## 2013-09-12 NOTE — Telephone Encounter (Signed)
It should be ready. Nicole Perkins has the RX

## 2013-09-12 NOTE — Telephone Encounter (Signed)
Pt states she was unaware that she has to pick up a rx for her Ritalin.  She said she needs this refilled and asks that we call her when this is ready to pick up.

## 2013-09-13 NOTE — Telephone Encounter (Signed)
Pt notified rx ready for pick up at front desk

## 2013-10-05 ENCOUNTER — Encounter: Payer: Self-pay | Admitting: Cardiovascular Disease

## 2013-10-05 ENCOUNTER — Ambulatory Visit (INDEPENDENT_AMBULATORY_CARE_PROVIDER_SITE_OTHER): Payer: Medicare Other | Admitting: Cardiovascular Disease

## 2013-10-05 VITALS — BP 120/62 | HR 77 | Ht 65.0 in | Wt 162.0 lb

## 2013-10-05 DIAGNOSIS — E785 Hyperlipidemia, unspecified: Secondary | ICD-10-CM

## 2013-10-05 DIAGNOSIS — G459 Transient cerebral ischemic attack, unspecified: Secondary | ICD-10-CM

## 2013-10-05 DIAGNOSIS — R42 Dizziness and giddiness: Secondary | ICD-10-CM

## 2013-10-05 DIAGNOSIS — I951 Orthostatic hypotension: Secondary | ICD-10-CM | POA: Diagnosis not present

## 2013-10-05 MED ORDER — FLUDROCORTISONE ACETATE 0.1 MG PO TABS: 0.1000 mg | ORAL_TABLET | Freq: Every day | ORAL | Status: DC

## 2013-10-05 NOTE — Assessment & Plan Note (Signed)
Some improvement with improved blood pressure on fludrocortisone. Encouraged compression hose and fluids with salt on days when she has low blood pressure

## 2013-10-05 NOTE — Patient Instructions (Addendum)
You are doing well. Please stay on aspirin once a day  Please increase the florinef up to 4 to 5 times a week  Download the apps for your phone: Search for pulse meter:   Instant heart rate, cardiograph  Please call us if you have new issues that need to be addressed before your next appt.  Your physician wants you to follow-up in: 6 months.  You will receive a reminder letter in the mail two months in advance. If you don't receive a letter, please call our office to schedule the follow-up appointment.

## 2013-10-05 NOTE — Assessment & Plan Note (Addendum)
Unable to exclude hypotension as a cause of her near syncope. Also unable to exclude vasovagal episodes. She will increase the Florinef from 3 times a week up to 5 times per week as symptoms did seem to improve with higher blood pressure. Tried to reassure her that systolic pressure 130 was not too high.

## 2013-10-05 NOTE — Assessment & Plan Note (Signed)
Encouraged her to stay on her current medications

## 2013-10-05 NOTE — Assessment & Plan Note (Signed)
Encouraged her to take low-dose aspirin on a regular basis

## 2013-10-05 NOTE — Progress Notes (Signed)
Patient ID: Nicole Perkins, female    DOB: 08/08/1946, 67 y.o.   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  On her previous office visit, She reported having several  episodes of feeling "weird in her head". Episode possibly 6 months ago but details are unclear. She was opening a blind while at home, felt that she was in slow motion with a feeling that she is going to pass out. She fell backwards and bruised herself.   She remembers an episode June 15 2013. She was at a grocery store, felt a strange feeling in her head. Did not describe it as a spinning. She had headache, stomach pain. Some nausea, heart pounding, tachycardia. She was worried that she was going to pass out. She looked around the store for some fatigue.she went to [facility] and was kept overnight. Testing was reportedly normal though details are not available . CT scan of the head was not performed .  On her past clinic visit, blood pressure was running very low, typically 100 systolic  She was started on Florinef several times per week for possible orthostatic hypotension episodes, unable to exclude vasovagal episodes She has started Florinef initially daily with improvement of her symptoms but was concerned as her systolic pressure was running 409, diastolic 70s. She seemed to think that this was too high and she stopped the Florinef. Symptoms returned. She's not taking Florinef every other day with mild improvement in her symptoms with occasional near syncope. She takes aspirin on an occasional basis. No longer taking Aggrenox. No longer taking Plavix  Echocardiogram 2 years ago was essentially normal. Saline contrast bubble study was not performed.  Transesophageal echo was recommended if clinically indicated given her recent stroke. She was started on Aggrenox. MRA did not show significant carotid arterial disease.  EKG shows normal sinus rhythm with rate 77 beats per minute, no significant ST or T wave changes   Outpatient Encounter Prescriptions as of 10/05/2013  Medication Sig  . alendronate (FOSAMAX) 70 MG tablet Take 1 tablet (70 mg total) by mouth every 7 (seven) days. Take with a full glass of water on an empty stomach.  Marland Kitchen BIOTIN 5000 PO Take one by mouth daily  . calcium carbonate 200 MG capsule Take 250 mg by mouth 2 (two) times daily with a meal.  . calcium citrate-vitamin D (SM CALCIUM CITRATE-VIT D) 315-200 MG-UNIT per tablet Take 1 tablet by mouth 2 (two) times daily.   . Cholecalciferol (VITAMIN D3) 2000 UNITS capsule Take 2,000 Units by mouth daily.   . Cyanocobalamin (VITAMIN B 12 PO) Take by mouth daily.  Marland Kitchen escitalopram (LEXAPRO) 20 MG tablet Take 1 tablet (20 mg total) by mouth daily. Please schedule an appointment for a physical for additional refills.  . fludrocortisone (FLORINEF) 0.1 MG tablet Take 1 tablet (0.1 mg total) by mouth daily.  Marland Kitchen gabapentin (NEURONTIN) 300 MG capsule Take 1 capsule (300 mg total) by mouth 2 (two) times daily.  Boris Lown Oil 1000 MG CAPS Take 1,000 mg by mouth daily.  Marland Kitchen lamoTRIgine (LAMICTAL) 200 MG tablet TAKE 1 TABLET BY MOUTH DAILY  . meloxicam (MOBIC) 15 MG tablet Take 15 mg by mouth daily.   . methylphenidate (RITALIN) 10 MG  tablet Take 10 mg by mouth as needed.  . niacin (NIASPAN) 1000 MG CR tablet Take 1 tablet (1,000 mg total) by mouth at bedtime.  Marland Kitchen omeprazole (PRILOSEC) 40 MG capsule Take 1 capsule (40 mg total) by mouth 2 (two) times daily. Please schedule an appointment for a physical for additional refills.  . simvastatin (ZOCOR) 40 MG tablet TAKE 1 TABLET (40 MG TOTAL) BY MOUTH DAILY.  . traZODone (DESYREL) 100 MG tablet TAKE 2 TABLETS AT BEDTIME    Review of Systems   Constitutional: Negative.   HENT: Negative.   Eyes: Negative.   Respiratory: Negative.   Cardiovascular: Negative.   Gastrointestinal: Negative.   Endocrine: Negative.   Musculoskeletal: Negative.   Skin: Negative.   Allergic/Immunologic: Negative.   Neurological: Positive for light-headedness.  Hematological: Negative.   Psychiatric/Behavioral: Negative.   All other systems reviewed and are negative.    BP 120/62  Pulse 77  Ht 5\' 5"  (1.651 m)  Wt 162 lb (73.483 kg)  BMI 26.96 kg/m2  Physical Exam  Nursing note and vitals reviewed. Constitutional: She is oriented to person, place, and time. She appears well-developed and well-nourished.  HENT:  Head: Normocephalic.  Nose: Nose normal.  Mouth/Throat: Oropharynx is clear and moist.  Eyes: Conjunctivae are normal. Pupils are equal, round, and reactive to light.  Neck: Normal range of motion. Neck supple. No JVD present.  Cardiovascular: Normal rate, regular rhythm, S1 normal, S2 normal, normal heart sounds and intact distal pulses.  Exam reveals no gallop and no friction rub.   No murmur heard. Pulmonary/Chest: Effort normal and breath sounds normal. No respiratory distress. She has no wheezes. She has no rales. She exhibits no tenderness.  Abdominal: Soft. Bowel sounds are normal. She exhibits no distension. There is no tenderness.  Musculoskeletal: Normal range of motion. She exhibits no edema and no tenderness.  Lymphadenopathy:    She has no cervical adenopathy.  Neurological: She is alert and oriented to person, place, and time. Coordination normal.  Skin: Skin is warm and dry. No rash noted. No erythema.  Psychiatric: She has a normal mood and affect. Her behavior is normal. Judgment and thought content normal.    Assessment and Plan

## 2013-10-27 DIAGNOSIS — M25519 Pain in unspecified shoulder: Secondary | ICD-10-CM | POA: Diagnosis not present

## 2013-10-27 DIAGNOSIS — M19019 Primary osteoarthritis, unspecified shoulder: Secondary | ICD-10-CM | POA: Diagnosis not present

## 2013-11-01 ENCOUNTER — Other Ambulatory Visit: Payer: Self-pay | Admitting: Cardiovascular Disease

## 2013-11-02 ENCOUNTER — Other Ambulatory Visit: Payer: Self-pay | Admitting: *Deleted

## 2013-11-02 MED ORDER — FLUDROCORTISONE ACETATE 0.1 MG PO TABS: 0.1000 mg | ORAL_TABLET | Freq: Every day | ORAL | Status: DC

## 2013-11-02 NOTE — Telephone Encounter (Signed)
Requested Prescriptions   Signed Prescriptions Disp Refills  . fludrocortisone (FLORINEF) 0.1 MG tablet 90 tablet 3    Sig: Take 1 tablet (0.1 mg total) by mouth daily.    Authorizing Provider: Minna Merritts    Ordering User: Britt Bottom

## 2013-12-10 ENCOUNTER — Other Ambulatory Visit: Payer: Self-pay | Admitting: Family Medicine

## 2013-12-30 ENCOUNTER — Other Ambulatory Visit: Payer: Self-pay | Admitting: *Deleted

## 2013-12-30 MED ORDER — LAMOTRIGINE 200 MG PO TABS: ORAL_TABLET | ORAL | Status: DC

## 2013-12-30 NOTE — Telephone Encounter (Signed)
Pt requesting medication refill. Last ov 05/2013 with no future appts scheduled. pls advise 

## 2013-12-30 NOTE — Addendum Note (Signed)
Addended by: Modena Nunnery on: 12/30/2013 08:25 AM   Modules accepted: Orders

## 2014-01-06 ENCOUNTER — Other Ambulatory Visit: Payer: Self-pay | Admitting: Family Medicine

## 2014-01-10 ENCOUNTER — Other Ambulatory Visit: Payer: Self-pay

## 2014-01-10 MED ORDER — METHYLPHENIDATE HCL 10 MG PO TABS: 10.0000 mg | ORAL_TABLET | ORAL | Status: DC | PRN

## 2014-01-10 NOTE — Telephone Encounter (Signed)
Spoke to pt and informed her Rx is available for pickup; pt informed a gov't issued photo id required for pickup

## 2014-01-10 NOTE — Telephone Encounter (Signed)
Pt left v/m requesting rx ritalin 10 mg; pt going out of town and request to pick up rx on 01/11/14 in afternoon.call when ready for pick up. Pt said instructions on ritalin 10 mg bottle is take 1 tab by mouth three times a day; med list has instructions 1 tab by mouth as needed.Please advise.

## 2014-01-16 ENCOUNTER — Ambulatory Visit (INDEPENDENT_AMBULATORY_CARE_PROVIDER_SITE_OTHER): Payer: Medicare Other | Admitting: Family Medicine

## 2014-01-16 ENCOUNTER — Encounter: Payer: Self-pay | Admitting: Family Medicine

## 2014-01-16 VITALS — BP 126/70 | HR 75 | Temp 98.2°F | Ht 64.0 in | Wt 183.5 lb

## 2014-01-16 DIAGNOSIS — R635 Abnormal weight gain: Secondary | ICD-10-CM | POA: Diagnosis not present

## 2014-01-16 DIAGNOSIS — F411 Generalized anxiety disorder: Secondary | ICD-10-CM

## 2014-01-16 DIAGNOSIS — F909 Attention-deficit hyperactivity disorder, unspecified type: Secondary | ICD-10-CM

## 2014-01-16 DIAGNOSIS — E785 Hyperlipidemia, unspecified: Secondary | ICD-10-CM

## 2014-01-16 LAB — CBC WITH DIFFERENTIAL/PLATELET
BASOS ABS: 0 10*3/uL (ref 0.0–0.1)
Basophils Relative: 0.5 % (ref 0.0–3.0)
EOS ABS: 0.1 10*3/uL (ref 0.0–0.7)
Eosinophils Relative: 1.6 % (ref 0.0–5.0)
HCT: 39.7 % (ref 36.0–46.0)
Hemoglobin: 13.1 g/dL (ref 12.0–15.0)
LYMPHS PCT: 19.9 % (ref 12.0–46.0)
Lymphs Abs: 1.4 10*3/uL (ref 0.7–4.0)
MCHC: 33 g/dL (ref 30.0–36.0)
MCV: 87.7 fl (ref 78.0–100.0)
Monocytes Absolute: 0.5 10*3/uL (ref 0.1–1.0)
Monocytes Relative: 7 % (ref 3.0–12.0)
NEUTROS PCT: 71 % (ref 43.0–77.0)
Neutro Abs: 4.9 10*3/uL (ref 1.4–7.7)
PLATELETS: 234 10*3/uL (ref 150.0–400.0)
RBC: 4.53 Mil/uL (ref 3.87–5.11)
RDW: 14.5 % (ref 11.5–14.6)
WBC: 6.9 10*3/uL (ref 4.5–10.5)

## 2014-01-16 LAB — LIPID PANEL
CHOL/HDL RATIO: 4
Cholesterol: 163 mg/dL (ref 0–200)
HDL: 40.4 mg/dL (ref >39.00)
LDL Cholesterol: 105 mg/dL — ABNORMAL HIGH (ref 0–99)
Triglycerides: 88 mg/dL (ref 0.0–149.0)
VLDL: 17.6 mg/dL (ref 0.0–40.0)

## 2014-01-16 LAB — COMPREHENSIVE METABOLIC PANEL
ALBUMIN: 3.8 g/dL (ref 3.5–5.2)
ALT: 15 U/L (ref 0–35)
AST: 19 U/L (ref 0–37)
Alkaline Phosphatase: 49 U/L (ref 39–117)
BUN: 17 mg/dL (ref 6–23)
CALCIUM: 9.4 mg/dL (ref 8.4–10.5)
CHLORIDE: 103 meq/L (ref 96–112)
CO2: 29 mEq/L (ref 19–32)
Creatinine, Ser: 0.9 mg/dL (ref 0.4–1.2)
GFR: 68.51 mL/min (ref >60.00)
Glucose, Bld: 120 mg/dL — ABNORMAL HIGH (ref 70–99)
POTASSIUM: 4.4 meq/L (ref 3.5–5.1)
SODIUM: 141 meq/L (ref 135–145)
Total Bilirubin: 0.4 mg/dL (ref 0.3–1.2)
Total Protein: 6.8 g/dL (ref 6.0–8.3)

## 2014-01-16 LAB — TSH: TSH: 2.07 u[IU]/mL (ref 0.35–5.50)

## 2014-01-16 NOTE — Assessment & Plan Note (Signed)
Deteriorated due to family stressors. Continue current rx and psychotherapy. The patient indicates understanding of these issues and agrees with the plan.

## 2014-01-16 NOTE — Assessment & Plan Note (Signed)
Likely due to increased stressors. Check labs today. Orders Placed This Encounter  Procedures  . CBC with Differential  . Comprehensive metabolic panel  . Lipid panel  . TSH

## 2014-01-16 NOTE — Assessment & Plan Note (Signed)
On Ritalin 

## 2014-01-16 NOTE — Assessment & Plan Note (Signed)
Labs today

## 2014-01-16 NOTE — Progress Notes (Signed)
Pre visit review using our clinic review tool, if applicable. No additional management support is needed unless otherwise documented below in the visit note. 

## 2014-01-16 NOTE — Patient Instructions (Signed)
Great to see you. Hang in there.  We will call you with your lab results.

## 2014-01-16 NOTE — Progress Notes (Signed)
Very pleasant 68 yo female here med refills.  Mood disorder- On Effexor and lamictal.  Having a difficult time with her daughter and going through court proceedings with her ex husband.  Has been more emotional lately.  Tearful, loose stools.  Does have a therapist from [place] who she keeps in contact with.  No SI or HI. She has been gaining weight- she thinks its is stress.   Wt Readings from Last 3 Encounters:  01/16/14 183 lb 8 oz (83.235 kg)  10/05/13 162 lb (73.483 kg)  07/04/13 169 lb 4 oz (76.771 kg)   ADHD- symptoms controlled on current dose of Ritalin.  HLD- on zocor 40 mg daily. Due for labs. Lab Results  Component Value Date   CHOL 153 06/15/2012   HDL 46.70 06/15/2012   LDLCALC 92 06/15/2012   LDLDIRECT 93.7 01/14/2011   TRIG 74.0 06/15/2012   CHOLHDL 3 06/15/2012    Patient Active Problem List   Diagnosis Date Noted  . Orthostatic hypotension 10/05/2013  . Lightheadedness 07/04/2013  . Anxiety state, unspecified 06/23/2013  . Partial tear of rotator cuff 05/27/2013  . Left shoulder pain 05/23/2013  . ADHD (attention deficit hyperactivity disorder) 01/14/2011  . TIA 05/22/2010  . OSTEOPENIA 01/21/2010  . HYPERLIPIDEMIA 11/28/2009  . MOOD DISORDER 11/28/2009  . GERD 11/28/2009  . COLONIC POLYPS, HX OF 11/28/2009   Past Medical History  Diagnosis Date  . Mood disorder     NOS  . Fibromyalgia   . GERD (gastroesophageal reflux disease)   . Hyperlipidemia   . IBS (irritable bowel syndrome)   . History of colonic polyps   . History of stroke     ministroke x3  . Imbalance 11/2012    s/p PT at [facility]  . Arthritis   . Osteoporosis   . CRVO (central retinal vein occlusion)     Left  . Stroke     TIA  . Cataract     right  . Depression   . Thyroid disease    Past Surgical History  Procedure Laterality Date  . Thyroidectomy  2007    partial   . Cosmetic surgery    . Cholecystectomy  1995  . Thumb arthroscopy      left  . Rectal ultrasound  N/A 05/16/2013    Procedure: RECTAL ULTRASOUND;  Surgeon: Leighton Ruff, MD;  Location: [facility] ENDOSCOPY;  Service: Endoscopy;  Laterality: N/A;  REQUEST FEMALE NURSE AND TECH  . Anal rectal manometry N/A 05/16/2013    Procedure: ANAL RECTAL MANOMETRY;  Surgeon: Leighton Ruff, MD;  Location: [facility] ENDOSCOPY;  Service: Endoscopy;  Laterality: N/A;   History  Substance Use Topics  . Smoking status: Former Smoker    Types: Cigarettes    Quit date: 10/06/1974  . Smokeless tobacco: Never Used  . Alcohol Use: Yes     Comment: Rare   Family History  Problem Relation Age of Onset  . Stroke Mother   . Stroke Father   . Heart disease Brother   . Cancer Brother   . Colon cancer Neg Hx   . Esophageal cancer Neg Hx   . Rectal cancer Neg Hx   . Stomach cancer Neg Hx    Allergies  Allergen Reactions  . Latex     Causes redness and raised areas on skin   Current Outpatient Prescriptions on File Prior to Visit  Medication Sig Dispense Refill  . alendronate (FOSAMAX) 70 MG tablet Take 1 tablet (70 mg total) by  mouth every 7 (seven) days. Take with a full glass of water on an empty stomach.  12 tablet  3  . aspirin EC 81 MG tablet Take 81 mg by mouth daily.      Marland Kitchen BIOTIN 5000 PO Take one by mouth daily      . calcium carbonate 200 MG capsule Take 250 mg by mouth 2 (two) times daily with a meal.      . calcium citrate-vitamin D (SM CALCIUM CITRATE-VIT D) 315-200 MG-UNIT per tablet Take 1 tablet by mouth 2 (two) times daily.       . Cholecalciferol (VITAMIN D3) 2000 UNITS capsule Take 2,000 Units by mouth daily.       . Cyanocobalamin (VITAMIN B 12 PO) Take by mouth daily.      Marland Kitchen escitalopram (LEXAPRO) 20 MG tablet Take 1 tablet (20 mg total) by mouth daily. Please schedule an appointment for a physical for additional refills.  90 tablet  0  . fludrocortisone (FLORINEF) 0.1 MG tablet Take 1 tablet (0.1 mg total) by mouth daily.  90 tablet  3  . gabapentin (NEURONTIN) 300 MG capsule Take 1 capsule (300 mg  total) by mouth 2 (two) times daily.  180 capsule  0  . Krill Oil 1000 MG CAPS Take 1,000 mg by mouth daily.      Marland Kitchen lamoTRIgine (LAMICTAL) 200 MG tablet TAKE 1 TABLET BY MOUTH DAILY  30 tablet  3  . meloxicam (MOBIC) 15 MG tablet Take 15 mg by mouth daily.       . methylphenidate (RITALIN) 10 MG tablet Take 1 tablet (10 mg total) by mouth as needed.  30 tablet  0  . niacin (NIASPAN) 1000 MG CR tablet Take 1 tablet (1,000 mg total) by mouth at bedtime.  90 tablet  3  . omeprazole (PRILOSEC) 40 MG capsule Take 1 capsule (40 mg total) by mouth 2 (two) times daily. Please schedule an appointment for a physical for additional refills.  180 capsule  0  . simvastatin (ZOCOR) 40 MG tablet TAKE 1 TABLET (40 MG TOTAL) BY MOUTH DAILY.  90 tablet  1  . traZODone (DESYREL) 100 MG tablet TAKE 2 TABLETS AT BEDTIME  180 tablet  3   No current facility-administered medications on file prior to visit.     The PMH, PSH, Social History, Family History, Medications, and allergies have been reviewed in [facility], and have been updated if relevant.   Review of Systems       See HPI   Physical Exam BP 126/70  Pulse 75  Temp(Src) 98.2 F (36.8 C) (Oral)  Ht 5\' 4"  (1.626 m)  Wt 183 lb 8 oz (83.235 kg)  BMI 31.48 kg/m2  SpO2 95%   General:  Well-developed,well-nourished,in no acute distress; alert,appropriate and cooperative throughout examination Head:  normocephalic and atraumatic.   Eyes:  vision grossly intact, pupils equal, pupils round, and pupils reactive to light.   Ears:  R ear normal and L ear normal.   Lungs:  Normal respiratory effort, chest expands symmetrically. Lungs are clear to auscultation, no crackles or wheezes. Heart:  Normal rate and regular rhythm. S1 and S2 normal without gallop, murmur, click, rub or other extra sounds. Abdomen:  Bowel sounds positive,abdomen soft and non-tender without masses, organomegaly or hernias noted. Msk:  No deformity or scoliosis noted of thoracic or lumbar  spine.   Extremities:  No clubbing, cyanosis, edema, or deformity noted with normal full range of motion of all joints.  Neurologic:  alert & oriented X3 and gait normal.   Skin:  Intact without suspicious lesions or rashes Psych: tearful, appropriate  Assessment and Plan:

## 2014-01-17 ENCOUNTER — Encounter: Payer: Self-pay | Admitting: Family Medicine

## 2014-01-24 ENCOUNTER — Telehealth: Payer: Self-pay

## 2014-01-24 NOTE — Telephone Encounter (Signed)
Nicole Perkins with [facility] pharmacy mgt left v/m requesting cb about methylphenidate prior auth. Nicole Perkins will fax over question needed. Ref # [id number].

## 2014-01-25 NOTE — Telephone Encounter (Signed)
Spoke to pt and informed her Rx has been approved through 10/05/2014

## 2014-02-09 ENCOUNTER — Other Ambulatory Visit: Payer: Self-pay | Admitting: Family Medicine

## 2014-02-10 ENCOUNTER — Other Ambulatory Visit: Payer: Self-pay | Admitting: *Deleted

## 2014-02-10 MED ORDER — ALENDRONATE SODIUM 70 MG PO TABS: 70.0000 mg | ORAL_TABLET | ORAL | Status: DC

## 2014-02-10 MED ORDER — ESCITALOPRAM OXALATE 20 MG PO TABS: 20.0000 mg | ORAL_TABLET | Freq: Every day | ORAL | Status: DC

## 2014-02-10 NOTE — Telephone Encounter (Signed)
Pt requesting medication refill. Last ov 01/2014 with no f/u appts scheduled. pls advise

## 2014-02-10 NOTE — Addendum Note (Signed)
Addended by: Lucille Passy on: 02/10/2014 11:52 AM   Modules accepted: Orders

## 2014-02-21 ENCOUNTER — Other Ambulatory Visit: Payer: Self-pay | Admitting: Family Medicine

## 2014-03-01 ENCOUNTER — Other Ambulatory Visit: Payer: Self-pay

## 2014-03-01 MED ORDER — METHYLPHENIDATE HCL 10 MG PO TABS: 10.0000 mg | ORAL_TABLET | ORAL | Status: DC | PRN

## 2014-03-01 NOTE — Telephone Encounter (Signed)
Pt left v/m requesting rx methylphenidate 10 mg with instructions 1 tab by mouth 3 times a day as needed. I did not change instructions on med list until Oked by Dr Deborra Medina. Call when ready for pick up.

## 2014-03-02 MED ORDER — METHYLPHENIDATE HCL 10 MG PO TABS: 10.0000 mg | ORAL_TABLET | ORAL | Status: DC | PRN

## 2014-03-02 MED ORDER — METHYLPHENIDATE HCL 10 MG PO TABS: 10.0000 mg | ORAL_TABLET | Freq: Three times a day (TID) | ORAL | Status: DC | PRN

## 2014-03-02 NOTE — Telephone Encounter (Signed)
Pt contacted Dr Deborra Medina and requested change in dosage. Ok per Dr Deborra Medina.

## 2014-03-02 NOTE — Addendum Note (Signed)
Addended by: Modena Nunnery on: 03/02/2014 09:41 AM   Modules accepted: Orders

## 2014-03-02 NOTE — Telephone Encounter (Signed)
Spoke to pt and informed her Rx is available for pickup; advised of dosage denial. Pt informed gov't issued photo id required for pickup

## 2014-03-02 NOTE — Addendum Note (Signed)
Addended by: Modena Nunnery on: 03/02/2014 12:30 PM   Modules accepted: Orders

## 2014-03-08 ENCOUNTER — Encounter: Payer: Self-pay | Admitting: Family Medicine

## 2014-03-08 DIAGNOSIS — Z79899 Other long term (current) drug therapy: Secondary | ICD-10-CM | POA: Diagnosis not present

## 2014-03-24 DIAGNOSIS — H2589 Other age-related cataract: Secondary | ICD-10-CM | POA: Diagnosis not present

## 2014-03-27 ENCOUNTER — Encounter: Payer: Self-pay | Admitting: Family Medicine

## 2014-03-31 ENCOUNTER — Encounter: Payer: Self-pay | Admitting: Cardiovascular Disease

## 2014-03-31 ENCOUNTER — Ambulatory Visit (INDEPENDENT_AMBULATORY_CARE_PROVIDER_SITE_OTHER): Payer: Medicare Other | Admitting: Cardiovascular Disease

## 2014-03-31 VITALS — BP 100/70 | HR 78 | Ht 64.0 in | Wt 185.0 lb

## 2014-03-31 DIAGNOSIS — R42 Dizziness and giddiness: Secondary | ICD-10-CM

## 2014-03-31 DIAGNOSIS — R635 Abnormal weight gain: Secondary | ICD-10-CM | POA: Diagnosis not present

## 2014-03-31 DIAGNOSIS — E785 Hyperlipidemia, unspecified: Secondary | ICD-10-CM

## 2014-03-31 DIAGNOSIS — I951 Orthostatic hypotension: Secondary | ICD-10-CM | POA: Diagnosis not present

## 2014-03-31 DIAGNOSIS — G459 Transient cerebral ischemic attack, unspecified: Secondary | ICD-10-CM

## 2014-03-31 NOTE — Assessment & Plan Note (Signed)
She has started to watch her diet, start a regular exercise program.

## 2014-03-31 NOTE — Progress Notes (Signed)
Patient ID: Nicole Perkins, female    DOB: 1946-08-22, 68 y.o.   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 Previous symptoms of near syncope concerning for orthostatic hypotension, started on Florinef for symptom relief. Unable to exclude vasovagal episodes  In followup today, she reports that her weight is up 20 pounds from her prior clinic visit. With this her blood pressure has improved. She stopped taking Florinef. She wonders if the Florinef may have contributed to some of her weight gain. She went through a divorce January through April, traveling to [place] . She denies having any near syncope or syncope symptoms. Blood pressure typically runs in the 244-010 range systolic   episode June 15 2013. She was at a grocery store, felt a strange feeling in her head. Did not describe it as a spinning. She had headache, stomach pain. Some nausea, heart pounding, tachycardia. She was worried that she was going to pass out. She looked around the store for some fatigue.she went to [facility] and was kept overnight. Testing was reportedly normal though details are not available . CT scan of the head was not performed .  Echocardiogram 2 years ago was essentially normal. Saline contrast bubble study was not performed. Transesophageal echo was recommended if clinically indicated given her recent stroke. She was started on Aggrenox. MRA did not show significant carotid arterial disease.  EKG shows normal sinus rhythm with rate 78 beats per minute, no significant ST or T wave changes   Outpatient Encounter Prescriptions as of 03/31/2014  Medication Sig  . alendronate (FOSAMAX) 70 MG tablet Take 1 tablet (70 mg total) by mouth  every 7 (seven) days. Take with a full glass of water on an empty stomach.  Marland Kitchen aspirin EC 81 MG tablet Take 81 mg by mouth daily.  . calcium carbonate 200 MG capsule Take 250 mg by mouth 2 (two) times daily with a meal.  . calcium citrate-vitamin D (SM CALCIUM CITRATE-VIT D) 315-200 MG-UNIT per tablet Take 1 tablet by mouth 2 (two) times daily.   . Cholecalciferol (VITAMIN D3) 2000 UNITS capsule Take 2,000 Units by mouth daily.   . Cyanocobalamin (VITAMIN B 12 PO) Take by mouth daily.  Marland Kitchen escitalopram (LEXAPRO) 20 MG tablet Take 1 tablet (20 mg total) by mouth daily.  Marland Kitchen gabapentin (NEURONTIN) 300 MG capsule Take 1 capsule (300 mg total) by mouth 2 (two) times daily.  Nicole Perkins 1000 MG CAPS Take 1,000 mg by mouth daily.  Marland Kitchen lamoTRIgine (LAMICTAL) 200 MG tablet TAKE 1 TABLET BY MOUTH DAILY  . meloxicam (MOBIC) 15 MG tablet Take 15 mg by mouth daily.   . methylphenidate (RITALIN) 10 MG tablet Take 1 tablet (10 mg total) by mouth as needed.  . niacin (NIASPAN) 1000 MG CR tablet Take 1 tablet (1,000 mg total) by mouth at bedtime.  Marland Kitchen omeprazole (PRILOSEC) 40 MG capsule TAKE ONE CAPSULE BY MOUTH TWICE DAILY  . simvastatin (ZOCOR) 40 MG tablet TAKE 1 TABLET (40 MG TOTAL) BY MOUTH DAILY.  . traZODone (DESYREL) 100 MG tablet TAKE 2 TABLETS AT BEDTIME   Review of Systems  Constitutional: Negative.   HENT: Negative.   Eyes: Negative.   Respiratory: Negative.   Cardiovascular: Negative.  Gastrointestinal: Negative.   Endocrine: Negative.   Musculoskeletal: Negative.   Skin: Negative.   Allergic/Immunologic: Negative.   Neurological: Negative.   Hematological: Negative.   Psychiatric/Behavioral: Negative.   All other systems reviewed and are negative.   BP 100/70  Pulse 78  Ht 5\' 4"  (1.626 m)  Wt 185 lb (83.915 kg)  BMI 31.74 kg/m2  Physical Exam  Nursing note and vitals reviewed. Constitutional: She is oriented to person, place, and time. She appears well-developed and well-nourished.   HENT:  Head: Normocephalic.  Nose: Nose normal.  Mouth/Throat: Oropharynx is clear and moist.  Eyes: Conjunctivae are normal. Pupils are equal, round, and reactive to light.  Neck: Normal range of motion. Neck supple. No JVD present.  Cardiovascular: Normal rate, regular rhythm, S1 normal, S2 normal, normal heart sounds and intact distal pulses.  Exam reveals no gallop and no friction rub.   No murmur heard. Pulmonary/Chest: Effort normal and breath sounds normal. No respiratory distress. She has no wheezes. She has no rales. She exhibits no tenderness.  Abdominal: Soft. Bowel sounds are normal. She exhibits no distension. There is no tenderness.  Musculoskeletal: Normal range of motion. She exhibits no edema and no tenderness.  Lymphadenopathy:    She has no cervical adenopathy.  Neurological: She is alert and oriented to person, place, and time. Coordination normal.  Skin: Skin is warm and dry. No rash noted. No erythema.  Psychiatric: She has a normal mood and affect. Her behavior is normal. Judgment and thought content normal.    Assessment and Plan

## 2014-03-31 NOTE — Patient Instructions (Signed)
You are doing well. No medication changes were made.  Please call us if you have new issues that need to be addressed before your next appt.  Your physician wants you to follow-up in: 12 months.  You will receive a reminder letter in the mail two months in advance. If you don't receive a letter, please call our office to schedule the follow-up appointment. 

## 2014-03-31 NOTE — Assessment & Plan Note (Signed)
Orthostatics did not show a significant drop. With standing, possibly 10 point difference from may 120 down to 287 systolic. No medication changes made. Would not restart Florinef. Blood pressure higher likely from weight up 20 pounds

## 2014-03-31 NOTE — Assessment & Plan Note (Signed)
No further lightheadedness without Florinef. Blood pressure does not appear to be running low

## 2014-03-31 NOTE — Assessment & Plan Note (Signed)
Cholesterol is at goal on the current lipid regimen. No changes to the medications were made.  

## 2014-04-12 ENCOUNTER — Other Ambulatory Visit: Payer: Self-pay | Admitting: Family Medicine

## 2014-04-30 DIAGNOSIS — H5789 Other specified disorders of eye and adnexa: Secondary | ICD-10-CM | POA: Diagnosis not present

## 2014-04-30 DIAGNOSIS — H2000 Unspecified acute and subacute iridocyclitis: Secondary | ICD-10-CM | POA: Diagnosis not present

## 2014-05-01 DIAGNOSIS — H20019 Primary iridocyclitis, unspecified eye: Secondary | ICD-10-CM | POA: Diagnosis not present

## 2014-05-08 ENCOUNTER — Other Ambulatory Visit: Payer: Self-pay | Admitting: Family Medicine

## 2014-05-08 NOTE — Telephone Encounter (Signed)
Pt requesting medication refill. Last f/u appt 01/2014 with no future appts scheduled. pls advise

## 2014-05-08 NOTE — Telephone Encounter (Signed)
Spoke to pt and informed her Rx is available for pickup from the front desk 

## 2014-05-09 DIAGNOSIS — H2510 Age-related nuclear cataract, unspecified eye: Secondary | ICD-10-CM | POA: Diagnosis not present

## 2014-05-17 ENCOUNTER — Other Ambulatory Visit: Payer: Self-pay | Admitting: Family Medicine

## 2014-05-23 ENCOUNTER — Telehealth: Payer: Self-pay | Admitting: Family Medicine

## 2014-05-23 NOTE — Telephone Encounter (Signed)
Patient Information:  Caller Name: Oralia  Phone: [phone]  Patient: Nicole Perkins  Gender: Female  DOB: 12-22-1945  Age: 68 Years  PCP: Arnette Norris [facility])  Office Follow Up:  Does the office need to follow up with this patient?: No  Instructions For The Office: N/A   Symptoms  Reason For Call & Symptoms: Pt calling regarding left sided back pain that travels to the groin area that is strictly positional. Excruciating when she tries to move that way but comfortable if not turning to the left. Onset 05/20/14. No chx in bowel or bladder habits. Not in pain at the moment. NO sx of UTI or hx of kidney stones. Has appt for 05/24/14 at 1000.  Reviewed Health History In EMR: Yes  Reviewed Medications In EMR: Yes  Reviewed Allergies In EMR: Yes  Reviewed Surgeries / Procedures: Yes  Date of Onset of Symptoms: 05/20/2014  Treatments Tried: 3, 500 mg Tylenol as hs on 05/22/14. Discussed proper doses and times of Tylenol and Ibuprofen.  Treatments Tried Worked: Yes  Guideline(s) Used:  Back Pain  Disposition Per Guideline:   Go to ED Now (or to Office with PCP Approval) Pt refused appt at another office. Prefers to see Dr. Deborra Medina and has an appt for 05/24/14 at 1000. Ok's by RN in office.   Reason For Disposition Reached:   Pain radiates into groin, scrotum  Advice Given: Discussed Advil or Tylenol doses and strict ER parameters if sx chx before 05/24/14 visit at 1000.   Patient Will Follow Care Advice:  YES

## 2014-05-24 ENCOUNTER — Other Ambulatory Visit: Payer: Self-pay | Admitting: Family Medicine

## 2014-05-24 ENCOUNTER — Ambulatory Visit (INDEPENDENT_AMBULATORY_CARE_PROVIDER_SITE_OTHER)
Admission: RE | Admit: 2014-05-24 | Discharge: 2014-05-24 | Disposition: A | Discharge status: Home / Self Care | Payer: Medicare Other | Source: Ambulatory Visit | Attending: Family Medicine | Admitting: Family Medicine

## 2014-05-24 ENCOUNTER — Encounter: Payer: Self-pay | Admitting: Family Medicine

## 2014-05-24 ENCOUNTER — Ambulatory Visit (INDEPENDENT_AMBULATORY_CARE_PROVIDER_SITE_OTHER): Payer: Medicare Other | Admitting: Family Medicine

## 2014-05-24 VITALS — BP 120/68 | HR 62 | Temp 97.7°F | Wt 191.0 lb

## 2014-05-24 DIAGNOSIS — R1032 Left lower quadrant pain: Secondary | ICD-10-CM

## 2014-05-24 DIAGNOSIS — R109 Unspecified abdominal pain: Secondary | ICD-10-CM | POA: Diagnosis not present

## 2014-05-24 DIAGNOSIS — M25559 Pain in unspecified hip: Secondary | ICD-10-CM | POA: Diagnosis not present

## 2014-05-24 DIAGNOSIS — N83209 Unspecified ovarian cyst, unspecified side: Secondary | ICD-10-CM | POA: Diagnosis not present

## 2014-05-24 DIAGNOSIS — N83201 Unspecified ovarian cyst, right side: Secondary | ICD-10-CM

## 2014-05-24 DIAGNOSIS — K5289 Other specified noninfective gastroenteritis and colitis: Secondary | ICD-10-CM

## 2014-05-24 DIAGNOSIS — K529 Noninfective gastroenteritis and colitis, unspecified: Secondary | ICD-10-CM

## 2014-05-24 LAB — CBC WITH DIFFERENTIAL/PLATELET
BASOS PCT: 0.4 % (ref 0.0–3.0)
Basophils Absolute: 0 10*3/uL (ref 0.0–0.1)
Eosinophils Absolute: 0.1 10*3/uL (ref 0.0–0.7)
Eosinophils Relative: 1.3 % (ref 0.0–5.0)
HCT: 39.7 % (ref 36.0–46.0)
HEMOGLOBIN: 13.4 g/dL (ref 12.0–15.0)
LYMPHS PCT: 23 % (ref 12.0–46.0)
Lymphs Abs: 1.3 10*3/uL (ref 0.7–4.0)
MCHC: 33.8 g/dL (ref 30.0–36.0)
MCV: 87.1 fl (ref 78.0–100.0)
MONOS PCT: 8.8 % (ref 3.0–12.0)
Monocytes Absolute: 0.5 10*3/uL (ref 0.1–1.0)
Neutro Abs: 3.8 10*3/uL (ref 1.4–7.7)
Neutrophils Relative %: 66.5 % (ref 43.0–77.0)
Platelets: 205 10*3/uL (ref 150.0–400.0)
RBC: 4.55 Mil/uL (ref 3.87–5.11)
RDW: 14.3 % (ref 11.5–15.5)
WBC: 5.7 10*3/uL (ref 4.0–10.5)

## 2014-05-24 LAB — COMPREHENSIVE METABOLIC PANEL
ALT: 17 U/L (ref 0–35)
AST: 18 U/L (ref 0–37)
Albumin: 3.7 g/dL (ref 3.5–5.2)
Alkaline Phosphatase: 43 U/L (ref 39–117)
BILIRUBIN TOTAL: 0.6 mg/dL (ref 0.2–1.2)
BUN: 15 mg/dL (ref 6–23)
CALCIUM: 9.3 mg/dL (ref 8.4–10.5)
CHLORIDE: 105 meq/L (ref 96–112)
CO2: 30 meq/L (ref 19–32)
Creatinine, Ser: 1.1 mg/dL (ref 0.4–1.2)
GFR: 54.49 mL/min — AB (ref >60.00)
Glucose, Bld: 109 mg/dL — ABNORMAL HIGH (ref 70–99)
Potassium: 4.5 mEq/L (ref 3.5–5.1)
Sodium: 140 mEq/L (ref 135–145)
Total Protein: 6.8 g/dL (ref 6.0–8.3)

## 2014-05-24 LAB — POCT URINALYSIS DIPSTICK
Bilirubin, UA: NEGATIVE
GLUCOSE UA: NEGATIVE
KETONES UA: NEGATIVE
Leukocytes, UA: NEGATIVE
NITRITE UA: NEGATIVE
PH UA: 6
Protein, UA: NEGATIVE
RBC UA: NEGATIVE
Spec Grav, UA: 1.02
Urobilinogen, UA: 0.2

## 2014-05-24 MED ORDER — AMOXICILLIN-POT CLAVULANATE 875-125 MG PO TABS: 1.0000 | ORAL_TABLET | Freq: Two times a day (BID) | ORAL | Status: AC

## 2014-05-24 MED ORDER — IOHEXOL 300 MG/ML  SOLN
100.0000 mL | Freq: Once | INTRAMUSCULAR | Status: AC | PRN
  Administered 2014-05-24: 100 mL via INTRAVENOUS

## 2014-05-24 NOTE — Assessment & Plan Note (Addendum)
New and progressive. Associated with left hip pain as well. UA neg for all components, including RBCs. Etiology unclear at this point- non toxic but needs stat imaging of left hip (?osteomyleitis although no clear source of infection) and abdomen (rule out nephrolithasis/bowel obstruction although both not classic for her current symptoms). Stat CBC and CMET today. Likely will need CT as well if imaging unremarkable. She knows to go to ER if any new symptoms develop or current symptoms worsen. The patient indicates understanding of these issues and agrees with the plan.

## 2014-05-24 NOTE — Addendum Note (Signed)
Addended by: Lucille Passy on: 05/24/2014 03:39 PM   Modules accepted: Orders

## 2014-05-24 NOTE — Patient Instructions (Signed)
Great to see you. I will call you as soon as I have your xray results.

## 2014-05-24 NOTE — Progress Notes (Signed)
Subjective:   Patient ID: Nicole Perkins, female    DOB: Apr 19, 1946, 68 y.o.   MRN: [id number]  Nicole Perkins is a pleasant 68 y.o. year old female who presents to clinic today with Flank Pain  on 05/24/2014  HPI: 4 days ago- acute onset of left hip pain that radiates to LLQ.  Very TTP throughout area of pain.  Pain is a 10/10 when it happens. Initially, only occurred with quick movements.  This morning, started to become continuous.  No nausea or vomiting. No blood in stool or changes in bowel habits.  No known injury.  No fevers.  No rashes.  Current Outpatient Prescriptions on File Prior to Visit  Medication Sig Dispense Refill  . alendronate (FOSAMAX) 70 MG tablet TAKE ONE TABLET BY MOUTH EVERY 7 DAYS. TAKE WITH FULL GLASS OF WATER ON EMPTY STOMACH  12 tablet  0  . calcium carbonate 200 MG capsule Take 250 mg by mouth 2 (two) times daily with a meal.      . calcium citrate-vitamin D (SM CALCIUM CITRATE-VIT D) 315-200 MG-UNIT per tablet Take 1 tablet by mouth 2 (two) times daily.       . Cholecalciferol (VITAMIN D3) 2000 UNITS capsule Take 2,000 Units by mouth daily.       . Cyanocobalamin (VITAMIN B 12 PO) Take by mouth daily.      Marland Kitchen escitalopram (LEXAPRO) 20 MG tablet Take 1 tablet (20 mg total) by mouth daily.  30 tablet  11  . gabapentin (NEURONTIN) 300 MG capsule Take 1 capsule (300 mg total) by mouth 2 (two) times daily.  180 capsule  0  . Krill Oil 1000 MG CAPS Take 1,000 mg by mouth daily.      Marland Kitchen lamoTRIgine (LAMICTAL) 200 MG tablet TAKE 1 TABLET BY MOUTH DAILY  30 tablet  3  . methylphenidate (RITALIN) 10 MG tablet TAKE ONE TABLET BY MOUTH THREE TIMES DAILY AS NEEDED  90 tablet  0  . niacin (NIASPAN) 1000 MG CR tablet Take 1 tablet (1,000 mg total) by mouth at bedtime.  90 tablet  3  . omeprazole (PRILOSEC) 40 MG capsule TAKE ONE CAPSULE BY MOUTH TWICE DAILY  180 capsule  1  . simvastatin (ZOCOR) 40 MG tablet TAKE ONE TABLET BY MOUTH ONCE DAILY  90 tablet  0  . traZODone  (DESYREL) 100 MG tablet TAKE 2 TABLETS AT BEDTIME  180 tablet  3   No current facility-administered medications on file prior to visit.    Allergies  Allergen Reactions  . Latex     Causes redness and raised areas on skin    Past Medical History  Diagnosis Date  . Mood disorder     NOS  . Fibromyalgia   . GERD (gastroesophageal reflux disease)   . Hyperlipidemia   . IBS (irritable bowel syndrome)   . History of colonic polyps   . History of stroke     ministroke x3  . Imbalance 11/2012    s/[facility] PT at [facility]  . Arthritis   . Osteoporosis   . CRVO (central retinal vein occlusion)     Left  . Stroke     TIA  . Cataract     right  . Depression   . Thyroid disease     Past Surgical History  Procedure Laterality Date  . Thyroidectomy  2007    partial   . Cosmetic surgery    . Cholecystectomy  1995  . Thumb arthroscopy  left  . Rectal ultrasound N/A 05/16/2013    Procedure: RECTAL ULTRASOUND;  Surgeon: Leighton Ruff, MD;  Location: [facility] ENDOSCOPY;  Service: Endoscopy;  Laterality: N/A;  REQUEST FEMALE NURSE AND TECH  . Anal rectal manometry N/A 05/16/2013    Procedure: ANAL RECTAL MANOMETRY;  Surgeon: Leighton Ruff, MD;  Location: [facility] ENDOSCOPY;  Service: Endoscopy;  Laterality: N/A;    Family History  Problem Relation Age of Onset  . Stroke Mother   . Stroke Father   . Heart disease Brother   . Cancer Brother   . Colon cancer Neg Hx   . Esophageal cancer Neg Hx   . Rectal cancer Neg Hx   . Stomach cancer Neg Hx     History   Social History  . Marital Status: Single    Spouse Name: N/A    Number of Children: 5  . Years of Education: Masters +   Occupational History  . Retired Education officer, museum    Social History Main Topics  . Smoking status: Former Smoker    Types: Cigarettes    Quit date: 10/06/1974  . Smokeless tobacco: Never Used  . Alcohol Use: Yes     Comment: Rare  . Drug Use: No  . Sexual Activity: Not on file   Other Topics Concern  .  Not on file   Social History Narrative  . No narrative on file   The PMH, PSH, Social History, Family History, Medications, and allergies have been reviewed in [facility], and have been updated if relevant.   Review of Systems See HPI No hematuria No dysuria No vaginal bleeding or pelvic pain Pain not worsened by walking    Objective:    BP 120/68  Pulse 62  Temp(Src) 97.7 F (36.5 C) (Oral)  Wt 191 lb (86.637 kg)  SpO2 96%   Physical Exam  Nursing note and vitals reviewed. Constitutional: She is oriented to person, place, and time. She appears well-developed and well-nourished.  Appears uncomfortable but non toxic  Abdominal: Soft. Bowel sounds are normal. She exhibits no distension and no mass. There is tenderness. There is guarding. There is no rebound.  Musculoskeletal:  TTP over left iliac crest, no tenderness over left trochanteric bursa  Neurological: She is alert and oriented to person, place, and time.  Skin: Skin is warm and dry. No rash noted.  Psychiatric: She has a normal mood and affect. Her behavior is normal. Judgment and thought content normal.          Assessment & Plan:   Abdominal pain, left lower quadrant - Plan: Urinalysis Dipstick, DG Hip Complete Left, DG Abd 1 View No Follow-up on file.

## 2014-05-24 NOTE — Progress Notes (Signed)
Pre visit review using our clinic review tool, if applicable. No additional management support is needed unless otherwise documented below in the visit note. 

## 2014-05-24 NOTE — Addendum Note (Signed)
Addended by: Lucille Passy on: 05/24/2014 02:59 PM   Modules accepted: Orders

## 2014-05-30 ENCOUNTER — Telehealth: Payer: Self-pay

## 2014-05-30 ENCOUNTER — Ambulatory Visit
Admission: RE | Admit: 2014-05-30 | Discharge: 2014-05-30 | Disposition: A | Discharge status: Home / Self Care | Payer: Medicare Other | Source: Ambulatory Visit | Attending: Family Medicine | Admitting: Family Medicine

## 2014-05-30 DIAGNOSIS — N83201 Unspecified ovarian cyst, right side: Secondary | ICD-10-CM

## 2014-05-30 DIAGNOSIS — N83209 Unspecified ovarian cyst, unspecified side: Secondary | ICD-10-CM | POA: Diagnosis not present

## 2014-05-30 NOTE — Telephone Encounter (Signed)
Pt had [place] of pelvis earlier this afternoon and pt wanted to know why she needed this test; advised pt from 05/24/14 conversation with Dr Deborra Medina the pelvis US was ordered to do further evaluation of ovarian cyst. Pt voiced understanding; pt said that was all she wanted to know and would wait for results of test. No cb needed until get results of pelvic US.

## 2014-06-02 ENCOUNTER — Encounter: Payer: Self-pay | Admitting: Family Medicine

## 2014-06-02 NOTE — Telephone Encounter (Signed)
Pt responded to via mychart and request forwarded to Dr Deborra Medina. Pt advised of Dr Hulen Shouts RTO 06/07/14

## 2014-06-08 ENCOUNTER — Other Ambulatory Visit: Payer: Self-pay | Admitting: Family Medicine

## 2014-06-08 ENCOUNTER — Encounter: Payer: Self-pay | Admitting: Family Medicine

## 2014-06-08 MED ORDER — FLUCONAZOLE 150 MG PO TABS: 150.0000 mg | ORAL_TABLET | Freq: Once | ORAL | Status: AC

## 2014-06-08 NOTE — Telephone Encounter (Signed)
Pt requesting diflucan; recently finished an abx 09/02

## 2014-06-13 ENCOUNTER — Ambulatory Visit: Payer: Medicare Other | Admitting: Gastroenterology

## 2014-06-19 ENCOUNTER — Other Ambulatory Visit: Payer: Self-pay | Admitting: Family Medicine

## 2014-06-19 NOTE — Telephone Encounter (Signed)
Pt requesting medication refill. Last f/u appt 01/2014, but med last refilled 06/2013. pls advise

## 2014-06-29 ENCOUNTER — Other Ambulatory Visit: Payer: Self-pay | Admitting: Family Medicine

## 2014-06-30 NOTE — Telephone Encounter (Signed)
Pt requesting medication refill. Last f/u appt 01/2014. Ok to fill per Dr Deborra Medina. Spoke to pt and informed her Rx will be available for pickup after 1400

## 2014-07-07 DIAGNOSIS — Z23 Encounter for immunization: Secondary | ICD-10-CM | POA: Diagnosis not present

## 2014-07-13 DIAGNOSIS — H2513 Age-related nuclear cataract, bilateral: Secondary | ICD-10-CM | POA: Diagnosis not present

## 2014-07-13 DIAGNOSIS — Z8673 Personal history of transient ischemic attack (TIA), and cerebral infarction without residual deficits: Secondary | ICD-10-CM | POA: Diagnosis not present

## 2014-07-13 DIAGNOSIS — H2512 Age-related nuclear cataract, left eye: Secondary | ICD-10-CM | POA: Diagnosis not present

## 2014-07-13 DIAGNOSIS — H524 Presbyopia: Secondary | ICD-10-CM | POA: Diagnosis not present

## 2014-07-13 DIAGNOSIS — F909 Attention-deficit hyperactivity disorder, unspecified type: Secondary | ICD-10-CM | POA: Diagnosis not present

## 2014-07-13 DIAGNOSIS — F419 Anxiety disorder, unspecified: Secondary | ICD-10-CM | POA: Diagnosis not present

## 2014-07-13 DIAGNOSIS — G4730 Sleep apnea, unspecified: Secondary | ICD-10-CM | POA: Diagnosis not present

## 2014-07-13 DIAGNOSIS — E785 Hyperlipidemia, unspecified: Secondary | ICD-10-CM | POA: Diagnosis not present

## 2014-07-13 DIAGNOSIS — E669 Obesity, unspecified: Secondary | ICD-10-CM | POA: Diagnosis not present

## 2014-07-13 DIAGNOSIS — F329 Major depressive disorder, single episode, unspecified: Secondary | ICD-10-CM | POA: Diagnosis not present

## 2014-07-13 DIAGNOSIS — K219 Gastro-esophageal reflux disease without esophagitis: Secondary | ICD-10-CM | POA: Diagnosis not present

## 2014-07-13 DIAGNOSIS — M1990 Unspecified osteoarthritis, unspecified site: Secondary | ICD-10-CM | POA: Diagnosis not present

## 2014-07-13 DIAGNOSIS — Z961 Presence of intraocular lens: Secondary | ICD-10-CM | POA: Diagnosis not present

## 2014-07-13 DIAGNOSIS — M797 Fibromyalgia: Secondary | ICD-10-CM | POA: Diagnosis not present

## 2014-07-20 HISTORY — PX: CATARACT EXTRACTION, BILATERAL: SHX1313

## 2014-07-27 DIAGNOSIS — M797 Fibromyalgia: Secondary | ICD-10-CM | POA: Diagnosis not present

## 2014-07-27 DIAGNOSIS — H524 Presbyopia: Secondary | ICD-10-CM | POA: Diagnosis not present

## 2014-07-27 DIAGNOSIS — H2511 Age-related nuclear cataract, right eye: Secondary | ICD-10-CM | POA: Diagnosis not present

## 2014-07-27 DIAGNOSIS — F329 Major depressive disorder, single episode, unspecified: Secondary | ICD-10-CM | POA: Diagnosis not present

## 2014-07-27 DIAGNOSIS — E785 Hyperlipidemia, unspecified: Secondary | ICD-10-CM | POA: Diagnosis not present

## 2014-07-27 DIAGNOSIS — G4730 Sleep apnea, unspecified: Secondary | ICD-10-CM | POA: Diagnosis not present

## 2014-07-27 DIAGNOSIS — Z8673 Personal history of transient ischemic attack (TIA), and cerebral infarction without residual deficits: Secondary | ICD-10-CM | POA: Diagnosis not present

## 2014-07-27 DIAGNOSIS — H52201 Unspecified astigmatism, right eye: Secondary | ICD-10-CM | POA: Diagnosis not present

## 2014-07-27 DIAGNOSIS — K219 Gastro-esophageal reflux disease without esophagitis: Secondary | ICD-10-CM | POA: Diagnosis not present

## 2014-07-27 DIAGNOSIS — M1990 Unspecified osteoarthritis, unspecified site: Secondary | ICD-10-CM | POA: Diagnosis not present

## 2014-07-28 DIAGNOSIS — H43812 Vitreous degeneration, left eye: Secondary | ICD-10-CM | POA: Diagnosis not present

## 2014-07-28 DIAGNOSIS — Z961 Presence of intraocular lens: Secondary | ICD-10-CM | POA: Diagnosis not present

## 2014-07-28 DIAGNOSIS — H34812 Central retinal vein occlusion, left eye: Secondary | ICD-10-CM | POA: Diagnosis not present

## 2014-08-01 DIAGNOSIS — H43813 Vitreous degeneration, bilateral: Secondary | ICD-10-CM | POA: Diagnosis not present

## 2014-08-01 DIAGNOSIS — H33321 Round hole, right eye: Secondary | ICD-10-CM | POA: Diagnosis not present

## 2014-08-01 DIAGNOSIS — H34812 Central retinal vein occlusion, left eye: Secondary | ICD-10-CM | POA: Diagnosis not present

## 2014-08-09 DIAGNOSIS — D485 Neoplasm of uncertain behavior of skin: Secondary | ICD-10-CM | POA: Diagnosis not present

## 2014-08-09 DIAGNOSIS — C44310 Basal cell carcinoma of skin of unspecified parts of face: Secondary | ICD-10-CM | POA: Diagnosis not present

## 2014-08-11 ENCOUNTER — Other Ambulatory Visit: Payer: Self-pay | Admitting: Family Medicine

## 2014-08-25 ENCOUNTER — Other Ambulatory Visit: Payer: Self-pay

## 2014-08-25 ENCOUNTER — Other Ambulatory Visit: Payer: Self-pay | Admitting: Family Medicine

## 2014-08-25 MED ORDER — METHYLPHENIDATE HCL 10 MG PO TABS: 10.0000 mg | ORAL_TABLET | Freq: Three times a day (TID) | ORAL | Status: DC | PRN

## 2014-08-25 NOTE — Telephone Encounter (Signed)
[facility] left v/m requesting rx for ritalin. Call pt at [phone] when ready for pick up.

## 2014-08-28 MED ORDER — METHYLPHENIDATE HCL 10 MG PO TABS: 10.0000 mg | ORAL_TABLET | Freq: Three times a day (TID) | ORAL | Status: DC | PRN

## 2014-08-28 NOTE — Telephone Encounter (Signed)
Spoke to pt and informed her Rx will be available for pickup after 1400

## 2014-08-28 NOTE — Addendum Note (Signed)
Addended by: Modena Nunnery on: 08/28/2014 10:23 AM   Modules accepted: Orders

## 2014-09-06 ENCOUNTER — Other Ambulatory Visit: Payer: Self-pay | Admitting: Family Medicine

## 2014-09-06 NOTE — Telephone Encounter (Signed)
Pt requesting medication refill. Pt has not been on this medication since 04/2013. pls advise

## 2014-09-07 ENCOUNTER — Other Ambulatory Visit: Payer: Self-pay | Admitting: Family Medicine

## 2014-10-06 HISTORY — PX: FOOT FRACTURE SURGERY: SHX645

## 2014-10-11 ENCOUNTER — Encounter: Payer: Self-pay | Admitting: Family Medicine

## 2014-10-11 DIAGNOSIS — Z681 Body mass index (BMI) 19 or less, adult: Secondary | ICD-10-CM | POA: Diagnosis not present

## 2014-10-11 DIAGNOSIS — C44319 Basal cell carcinoma of skin of other parts of face: Secondary | ICD-10-CM | POA: Diagnosis not present

## 2014-10-11 DIAGNOSIS — L578 Other skin changes due to chronic exposure to nonionizing radiation: Secondary | ICD-10-CM | POA: Diagnosis not present

## 2014-10-11 NOTE — Telephone Encounter (Signed)
pts last f/u appt 01/2014. See mychart message

## 2014-10-12 ENCOUNTER — Telehealth: Payer: Self-pay | Admitting: Family Medicine

## 2014-10-12 NOTE — Telephone Encounter (Signed)
Pt called in to request her medication. She said that she thought that it should already be ready but I didn't see it in the box nor did I see a note that it was ready. Pt says that she has 7 left and that will last her about 2-3 days. She is requesting a call back.

## 2014-10-12 NOTE — Telephone Encounter (Signed)
Pt requesting medication refill at current dose. See mychart message. Pls advise

## 2014-10-12 NOTE — Telephone Encounter (Signed)
Yes there is a time released option that we could try if she would like.

## 2014-10-12 NOTE — Telephone Encounter (Signed)
Spoke to pt and informed her that pharmacy did not sent electronic request. Advised her that I would send request to Dr Deborra Medina, and she will be contacted, if approved, for pickup

## 2014-10-13 NOTE — Telephone Encounter (Signed)
Pt is wanting to try time released. See mychart message

## 2014-10-13 NOTE — Addendum Note (Signed)
Addended by: Lucille Passy on: 10/13/2014 10:31 AM   Modules accepted: Orders, Medications

## 2014-10-13 NOTE — Telephone Encounter (Signed)
Ok to print out and put in my box for signature. 

## 2014-10-16 MED ORDER — METHYLPHENIDATE HCL ER (LA) 30 MG PO CP24
30.0000 mg | ORAL_CAPSULE | ORAL | Status: DC
Start: 2014-10-16 — End: 2014-11-21

## 2014-10-16 NOTE — Telephone Encounter (Signed)
Spoke to pt and informed her Rx will be available for pickup after 1400. Pt advised third party unable to pickup

## 2014-10-16 NOTE — Addendum Note (Signed)
Addended by: Modena Nunnery on: 10/16/2014 09:54 AM   Modules accepted: Orders

## 2014-10-18 ENCOUNTER — Encounter: Payer: Self-pay | Admitting: Family Medicine

## 2014-10-18 DIAGNOSIS — Z79899 Other long term (current) drug therapy: Secondary | ICD-10-CM | POA: Diagnosis not present

## 2014-10-30 ENCOUNTER — Other Ambulatory Visit: Payer: Self-pay | Admitting: Family Medicine

## 2014-10-31 ENCOUNTER — Encounter: Payer: Self-pay | Admitting: Family Medicine

## 2014-10-31 DIAGNOSIS — H34812 Central retinal vein occlusion, left eye: Secondary | ICD-10-CM | POA: Diagnosis not present

## 2014-10-31 DIAGNOSIS — H33321 Round hole, right eye: Secondary | ICD-10-CM | POA: Diagnosis not present

## 2014-10-31 DIAGNOSIS — H43813 Vitreous degeneration, bilateral: Secondary | ICD-10-CM | POA: Diagnosis not present

## 2014-10-31 NOTE — Telephone Encounter (Signed)
Received refill request electronically from pharmacy. Last refill 08/25/14 #30/1, last office visit 05/24/14. Is it okay to refill medication?

## 2014-11-02 ENCOUNTER — Other Ambulatory Visit: Payer: Self-pay | Admitting: Family Medicine

## 2014-11-03 DIAGNOSIS — F319 Bipolar disorder, unspecified: Secondary | ICD-10-CM | POA: Diagnosis not present

## 2014-11-03 DIAGNOSIS — Z79899 Other long term (current) drug therapy: Secondary | ICD-10-CM | POA: Diagnosis not present

## 2014-11-03 DIAGNOSIS — M19072 Primary osteoarthritis, left ankle and foot: Secondary | ICD-10-CM | POA: Diagnosis not present

## 2014-11-03 DIAGNOSIS — E785 Hyperlipidemia, unspecified: Secondary | ICD-10-CM | POA: Diagnosis not present

## 2014-11-03 DIAGNOSIS — S92356A Nondisplaced fracture of fifth metatarsal bone, unspecified foot, initial encounter for closed fracture: Secondary | ICD-10-CM | POA: Diagnosis not present

## 2014-11-03 DIAGNOSIS — Z8673 Personal history of transient ischemic attack (TIA), and cerebral infarction without residual deficits: Secondary | ICD-10-CM | POA: Diagnosis not present

## 2014-11-03 DIAGNOSIS — W19XXXA Unspecified fall, initial encounter: Secondary | ICD-10-CM | POA: Diagnosis not present

## 2014-11-03 DIAGNOSIS — Z87891 Personal history of nicotine dependence: Secondary | ICD-10-CM | POA: Diagnosis not present

## 2014-11-03 DIAGNOSIS — Z681 Body mass index (BMI) 19 or less, adult: Secondary | ICD-10-CM | POA: Diagnosis not present

## 2014-11-03 DIAGNOSIS — S92355A Nondisplaced fracture of fifth metatarsal bone, left foot, initial encounter for closed fracture: Secondary | ICD-10-CM | POA: Diagnosis not present

## 2014-11-03 DIAGNOSIS — S9032XA Contusion of left foot, initial encounter: Secondary | ICD-10-CM | POA: Diagnosis not present

## 2014-11-03 DIAGNOSIS — S99912A Unspecified injury of left ankle, initial encounter: Secondary | ICD-10-CM | POA: Diagnosis not present

## 2014-11-03 DIAGNOSIS — Z7982 Long term (current) use of aspirin: Secondary | ICD-10-CM | POA: Diagnosis not present

## 2014-11-03 DIAGNOSIS — S92212A Displaced fracture of cuboid bone of left foot, initial encounter for closed fracture: Secondary | ICD-10-CM | POA: Diagnosis not present

## 2014-11-03 DIAGNOSIS — S92352A Displaced fracture of fifth metatarsal bone, left foot, initial encounter for closed fracture: Secondary | ICD-10-CM | POA: Diagnosis not present

## 2014-11-09 DIAGNOSIS — Z681 Body mass index (BMI) 19 or less, adult: Secondary | ICD-10-CM | POA: Diagnosis not present

## 2014-11-09 DIAGNOSIS — S92355D Nondisplaced fracture of fifth metatarsal bone, left foot, subsequent encounter for fracture with routine healing: Secondary | ICD-10-CM | POA: Diagnosis not present

## 2014-11-09 DIAGNOSIS — S92212D Displaced fracture of cuboid bone of left foot, subsequent encounter for fracture with routine healing: Secondary | ICD-10-CM | POA: Diagnosis not present

## 2014-11-18 ENCOUNTER — Other Ambulatory Visit: Payer: Self-pay | Admitting: Family Medicine

## 2014-11-20 NOTE — Telephone Encounter (Signed)
Last appt was an acute in 05/21/14, previously had a med f/u in 4/15, no future appt's scheduled. Last rx for prn Ritalin written on 08/28/14. Not due for UDS screen until 6/16.

## 2014-11-20 NOTE — Telephone Encounter (Signed)
Per email I think from 10/11/14 pt should now be taking extended release Ritalin. Not this RX. Please call pt to verify

## 2014-11-20 NOTE — Telephone Encounter (Signed)
Pt let v/m requesting cb with status of methalphenidate refill.

## 2014-11-21 MED ORDER — METHYLPHENIDATE HCL ER (LA) 30 MG PO CP24
30.0000 mg | ORAL_CAPSULE | ORAL | Status: DC
Start: 2014-11-21 — End: 2014-12-20

## 2014-11-21 NOTE — Telephone Encounter (Signed)
Pt states she is taking the ER 30mg  QD---medication corrected in chart and Rx printed and placed in your inbox to sign--Rx will be placed in front office and pt is aware

## 2014-11-21 NOTE — Telephone Encounter (Signed)
Left detailed msg on VM per HIPAA Need to know if pt is on regular or extended release medication

## 2014-11-22 NOTE — Telephone Encounter (Signed)
Spoke to pt and informed her Rx is available for pickup from the front desk 

## 2014-11-22 NOTE — Telephone Encounter (Signed)
RX printed and signed, given to [facility]

## 2014-11-24 ENCOUNTER — Other Ambulatory Visit: Payer: Self-pay | Admitting: Family Medicine

## 2014-12-20 ENCOUNTER — Encounter: Payer: Self-pay | Admitting: Family Medicine

## 2014-12-20 ENCOUNTER — Ambulatory Visit (INDEPENDENT_AMBULATORY_CARE_PROVIDER_SITE_OTHER): Payer: Medicare Other | Admitting: Family Medicine

## 2014-12-20 VITALS — BP 114/68 | HR 97 | Temp 98.6°F | Wt 189.0 lb

## 2014-12-20 DIAGNOSIS — Z01419 Encounter for gynecological examination (general) (routine) without abnormal findings: Secondary | ICD-10-CM

## 2014-12-20 DIAGNOSIS — Z23 Encounter for immunization: Secondary | ICD-10-CM

## 2014-12-20 DIAGNOSIS — Z79899 Other long term (current) drug therapy: Secondary | ICD-10-CM

## 2014-12-20 DIAGNOSIS — F411 Generalized anxiety disorder: Secondary | ICD-10-CM

## 2014-12-20 DIAGNOSIS — E785 Hyperlipidemia, unspecified: Secondary | ICD-10-CM | POA: Diagnosis not present

## 2014-12-20 DIAGNOSIS — F909 Attention-deficit hyperactivity disorder, unspecified type: Secondary | ICD-10-CM | POA: Diagnosis not present

## 2014-12-20 DIAGNOSIS — Z0000 Encounter for general adult medical examination without abnormal findings: Secondary | ICD-10-CM

## 2014-12-20 LAB — CBC WITH DIFFERENTIAL/PLATELET
Basophils Absolute: 0 10*3/uL (ref 0.0–0.1)
Basophils Relative: 0.4 % (ref 0.0–3.0)
EOS PCT: 0.4 % (ref 0.0–5.0)
Eosinophils Absolute: 0 10*3/uL (ref 0.0–0.7)
HCT: 40.9 % (ref 36.0–46.0)
Hemoglobin: 13.8 g/dL (ref 12.0–15.0)
Lymphocytes Relative: 16.9 % (ref 12.0–46.0)
Lymphs Abs: 1.4 10*3/uL (ref 0.7–4.0)
MCHC: 33.8 g/dL (ref 30.0–36.0)
MCV: 86.8 fl (ref 78.0–100.0)
MONOS PCT: 6.3 % (ref 3.0–12.0)
Monocytes Absolute: 0.5 10*3/uL (ref 0.1–1.0)
NEUTROS PCT: 76 % (ref 43.0–77.0)
Neutro Abs: 6.3 10*3/uL (ref 1.4–7.7)
Platelets: 232 10*3/uL (ref 150.0–400.0)
RBC: 4.71 Mil/uL (ref 3.87–5.11)
RDW: 13.8 % (ref 11.5–15.5)
WBC: 8.3 10*3/uL (ref 4.0–10.5)

## 2014-12-20 LAB — LIPID PANEL
Cholesterol: 172 mg/dL (ref 0–200)
HDL: 36.2 mg/dL — AB (ref >39.00)
LDL Cholesterol: 113 mg/dL — ABNORMAL HIGH (ref 0–99)
NONHDL: 135.8
Total CHOL/HDL Ratio: 5
Triglycerides: 115 mg/dL (ref 0.0–149.0)
VLDL: 23 mg/dL (ref 0.0–40.0)

## 2014-12-20 LAB — COMPREHENSIVE METABOLIC PANEL
ALBUMIN: 4.5 g/dL (ref 3.5–5.2)
ALT: 17 U/L (ref 0–35)
AST: 20 U/L (ref 0–37)
Alkaline Phosphatase: 58 U/L (ref 39–117)
BUN: 15 mg/dL (ref 6–23)
CHLORIDE: 104 meq/L (ref 96–112)
CO2: 30 mEq/L (ref 19–32)
Calcium: 9.4 mg/dL (ref 8.4–10.5)
Creatinine, Ser: 1 mg/dL (ref 0.40–1.20)
GFR: 58.18 mL/min — ABNORMAL LOW (ref >60.00)
Glucose, Bld: 110 mg/dL — ABNORMAL HIGH (ref 70–99)
Potassium: 4.2 mEq/L (ref 3.5–5.1)
Sodium: 140 mEq/L (ref 135–145)
Total Bilirubin: 0.4 mg/dL (ref 0.2–1.2)
Total Protein: 7.1 g/dL (ref 6.0–8.3)

## 2014-12-20 LAB — VITAMIN B12: Vitamin B-12: 1067 pg/mL — ABNORMAL HIGH (ref 211–911)

## 2014-12-20 LAB — TSH: TSH: 2.38 u[IU]/mL (ref 0.35–4.50)

## 2014-12-20 MED ORDER — SIMVASTATIN 40 MG PO TABS: 40.0000 mg | ORAL_TABLET | Freq: Every day | ORAL | Status: DC

## 2014-12-20 MED ORDER — METHYLPHENIDATE HCL 10 MG PO TABS: 10.0000 mg | ORAL_TABLET | Freq: Three times a day (TID) | ORAL | Status: DC | PRN

## 2014-12-20 NOTE — Progress Notes (Signed)
Pre visit review using our clinic review tool, if applicable. No additional management support is needed unless otherwise documented below in the visit note. 

## 2014-12-20 NOTE — Progress Notes (Signed)
Very pleasant 69 yo female here med refills.  Mood disorder- On Effexor and lamictal.  Does have a therapist from [place] who she keeps in contact with.  No SI or HI. She has been gaining weight- she thinks its is stress. Difficult time with her daughter lately.  Also unfortunately broke her foot 6 weeks ago.  Healing ok now that she is no longer working. Wt Readings from Last 3 Encounters:  12/20/14 189 lb (85.73 kg)  05/24/14 191 lb (86.637 kg)  03/31/14 185 lb (83.915 kg)   ADHD- symptoms were controlled on current dose of Ritalin but lately feels extended release not lasting as long. She would like to try short acting rx again.  HLD- on zocor 40 mg daily. Due for labs. Lab Results  Component Value Date   CHOL 163 01/16/2014   HDL 40.40 01/16/2014   LDLCALC 105* 01/16/2014   LDLDIRECT 93.7 01/14/2011   TRIG 88.0 01/16/2014   CHOLHDL 4 01/16/2014    Patient Active Problem List   Diagnosis Date Noted  . Weight gain 01/16/2014  . Orthostatic hypotension 10/05/2013  . Lightheadedness 07/04/2013  . Anxiety state 06/23/2013  . Partial tear of rotator cuff 05/27/2013  . Left shoulder pain 05/23/2013  . ADHD (attention deficit hyperactivity disorder) 01/14/2011  . TIA 05/22/2010  . OSTEOPENIA 01/21/2010  . HLD (hyperlipidemia) 11/28/2009  . MOOD DISORDER 11/28/2009  . GERD 11/28/2009  . COLONIC POLYPS, HX OF 11/28/2009   Past Medical History  Diagnosis Date  . Mood disorder     NOS  . Fibromyalgia   . GERD (gastroesophageal reflux disease)   . Hyperlipidemia   . IBS (irritable bowel syndrome)   . History of colonic polyps   . History of stroke     ministroke x3  . Imbalance 11/2012    s/p PT at [facility]  . Arthritis   . Osteoporosis   . CRVO (central retinal vein occlusion)     Left  . Stroke     TIA  . Cataract     right  . Depression   . Thyroid disease    Past Surgical History  Procedure Laterality Date  . Thyroidectomy  2007    partial   .  Cosmetic surgery    . Cholecystectomy  1995  . Thumb arthroscopy      left  . Rectal ultrasound N/A 05/16/2013    Procedure: RECTAL ULTRASOUND;  Surgeon: Leighton Ruff, MD;  Location: [facility];  Service: Endoscopy;  Laterality: N/A;  REQUEST FEMALE NURSE AND TECH  . Anal rectal manometry N/A 05/16/2013    Procedure: ANAL RECTAL MANOMETRY;  Surgeon: Leighton Ruff, MD;  Location: [facility];  Service: Endoscopy;  Laterality: N/A;   History  Substance Use Topics  . Smoking status: Former Smoker    Types: Cigarettes    Quit date: 10/06/1974  . Smokeless tobacco: Never Used  . Alcohol Use: Yes     Comment: Rare   Family History  Problem Relation Age of Onset  . Stroke Mother   . Stroke Father   . Heart disease Brother   . Cancer Brother   . Colon cancer Neg Hx   . Esophageal cancer Neg Hx   . Rectal cancer Neg Hx   . Stomach cancer Neg Hx    Allergies  Allergen Reactions  . Latex     Causes redness and raised areas on skin   Current Outpatient Prescriptions on File Prior to Visit  Medication Sig  Dispense Refill  . alendronate (FOSAMAX) 70 MG tablet TAKE ONE TABLET BY MOUTH EVERY 7 DAYS. TAKE WITH FULL GLASS OF WATER ON EMPTY STOMACH 12 tablet 1  . calcium carbonate 200 MG capsule Take 250 mg by mouth 2 (two) times daily with a meal.    . calcium citrate-vitamin D (SM CALCIUM CITRATE-VIT D) 315-200 MG-UNIT per tablet Take 1 tablet by mouth 2 (two) times daily.     . Cholecalciferol (VITAMIN D3) 2000 UNITS capsule Take 2,000 Units by mouth daily.     . Cyanocobalamin (VITAMIN B 12 PO) Take by mouth daily.    Marland Kitchen escitalopram (LEXAPRO) 20 MG tablet Take 1 tablet (20 mg total) by mouth daily. 30 tablet 11  . gabapentin (NEURONTIN) 300 MG capsule TAKE ONE CAPSULE BY MOUTH TWICE DAILY 180 capsule 0  . Krill Oil 1000 MG CAPS Take 1,000 mg by mouth daily.    Marland Kitchen lamoTRIgine (LAMICTAL) 200 MG tablet TAKE ONE TABLET BY MOUTH ONCE DAILY 30 tablet 0  . niacin (NIASPAN) 1000 MG CR tablet  Take 1 tablet (1,000 mg total) by mouth at bedtime. 90 tablet 3  . omeprazole (PRILOSEC) 40 MG capsule TAKE ONE CAPSULE BY MOUTH TWICE DAILY 180 capsule 2  . traZODone (DESYREL) 100 MG tablet TAKE TWO TABLETS BY MOUTH AT bedtime 180 tablet 0   No current facility-administered medications on file prior to visit.     The PMH, PSH, Social History, Family History, Medications, and allergies have been reviewed in [facility], and have been updated if relevant.   Review of Systems    Review of Systems  Constitutional: Negative.   Psychiatric/Behavioral: Positive for dysphoric mood and decreased concentration. Negative for suicidal ideas, sleep disturbance and self-injury. The patient is nervous/anxious.   All other systems reviewed and are negative.     Physical Exam BP 114/68 mmHg  Pulse 97  Temp(Src) 98.6 F (37 C) (Oral)  Wt 189 lb (85.73 kg)  SpO2 95%   General:  Well-developed,well-nourished,in no acute distress; alert,appropriate and cooperative throughout examination Head:  normocephalic and atraumatic.   Eyes:  vision grossly intact, pupils equal, pupils round, and pupils reactive to light.   Ears:  R ear normal and L ear normal.   Lungs:  Normal respiratory effort, chest expands symmetrically. Lungs are clear to auscultation, no crackles or wheezes. Heart:  Normal rate and regular rhythm. S1 and S2 normal without gallop, murmur, click, rub or other extra sounds. Abdomen:  Bowel sounds positive,abdomen soft and non-tender without masses, organomegaly or hernias noted. Msk:  No deformity or scoliosis noted of thoracic or lumbar spine.   Extremities:  No clubbing, cyanosis, edema, or deformity noted with normal full range of motion of all joints.   Neurologic:  alert & oriented X3 and gait normal.   Skin:  Intact without suspicious lesions or rashes Psych: tearful, appropriate

## 2014-12-20 NOTE — Assessment & Plan Note (Signed)
Due for labs. Continue current rx for now.

## 2014-12-20 NOTE — Assessment & Plan Note (Signed)
Deteriorated. Likely worsened by recent stressors. >25 minutes spent in face to face time with patient, >50% spent in counselling or coordination of care Will change to short acting Ritalin 10 mg three times daily. She will keep me updated.

## 2014-12-20 NOTE — Patient Instructions (Signed)
Good to see you.  Let me know how the Ritalin works.

## 2014-12-21 ENCOUNTER — Encounter: Payer: Self-pay | Admitting: Family Medicine

## 2014-12-21 DIAGNOSIS — S92352D Displaced fracture of fifth metatarsal bone, left foot, subsequent encounter for fracture with routine healing: Secondary | ICD-10-CM | POA: Diagnosis not present

## 2014-12-21 DIAGNOSIS — S92212D Displaced fracture of cuboid bone of left foot, subsequent encounter for fracture with routine healing: Secondary | ICD-10-CM | POA: Diagnosis not present

## 2014-12-21 DIAGNOSIS — Z6831 Body mass index (BMI) 31.0-31.9, adult: Secondary | ICD-10-CM | POA: Diagnosis not present

## 2014-12-21 DIAGNOSIS — S92215D Nondisplaced fracture of cuboid bone of left foot, subsequent encounter for fracture with routine healing: Secondary | ICD-10-CM | POA: Diagnosis not present

## 2014-12-21 DIAGNOSIS — M79672 Pain in left foot: Secondary | ICD-10-CM | POA: Diagnosis not present

## 2015-01-13 ENCOUNTER — Other Ambulatory Visit: Payer: Self-pay | Admitting: Family Medicine

## 2015-01-18 DIAGNOSIS — S92352D Displaced fracture of fifth metatarsal bone, left foot, subsequent encounter for fracture with routine healing: Secondary | ICD-10-CM | POA: Diagnosis not present

## 2015-01-18 DIAGNOSIS — S92212D Displaced fracture of cuboid bone of left foot, subsequent encounter for fracture with routine healing: Secondary | ICD-10-CM | POA: Diagnosis not present

## 2015-01-19 ENCOUNTER — Other Ambulatory Visit: Payer: Self-pay | Admitting: Family Medicine

## 2015-01-30 DIAGNOSIS — H26493 Other secondary cataract, bilateral: Secondary | ICD-10-CM | POA: Diagnosis not present

## 2015-02-01 ENCOUNTER — Other Ambulatory Visit: Payer: Self-pay | Admitting: Family Medicine

## 2015-02-12 DIAGNOSIS — M79672 Pain in left foot: Secondary | ICD-10-CM | POA: Diagnosis not present

## 2015-02-13 ENCOUNTER — Other Ambulatory Visit: Payer: Self-pay | Admitting: Family Medicine

## 2015-02-13 NOTE — Telephone Encounter (Signed)
Last f/u appt 12/2014 

## 2015-02-13 NOTE — Telephone Encounter (Signed)
Spoke to pt and informed her Rx is available for pickup from the front desk 

## 2015-02-15 DIAGNOSIS — S92352D Displaced fracture of fifth metatarsal bone, left foot, subsequent encounter for fracture with routine healing: Secondary | ICD-10-CM | POA: Diagnosis not present

## 2015-02-23 DIAGNOSIS — S92352D Displaced fracture of fifth metatarsal bone, left foot, subsequent encounter for fracture with routine healing: Secondary | ICD-10-CM | POA: Diagnosis not present

## 2015-03-07 DIAGNOSIS — S92352D Displaced fracture of fifth metatarsal bone, left foot, subsequent encounter for fracture with routine healing: Secondary | ICD-10-CM | POA: Diagnosis not present

## 2015-04-16 ENCOUNTER — Other Ambulatory Visit: Payer: Self-pay | Admitting: Family Medicine

## 2015-04-26 ENCOUNTER — Other Ambulatory Visit: Payer: Self-pay

## 2015-04-26 MED ORDER — METHYLPHENIDATE HCL 10 MG PO TABS: 10.0000 mg | ORAL_TABLET | Freq: Three times a day (TID) | ORAL | Status: DC | PRN

## 2015-04-26 NOTE — Telephone Encounter (Signed)
Pt left v/m requesting rx methylphenidate. Call when ready for pick up.  rx last printed # 90 on 02/13/15 and pt seen 12/20/14.Please advise. Pt will be in this area on 04/27/15 and would like to pick up rx on 04/27/15.

## 2015-04-27 ENCOUNTER — Encounter: Payer: Self-pay | Admitting: Family Medicine

## 2015-04-27 DIAGNOSIS — Z79899 Other long term (current) drug therapy: Secondary | ICD-10-CM | POA: Diagnosis not present

## 2015-04-27 MED ORDER — METHYLPHENIDATE HCL 10 MG PO TABS: 10.0000 mg | ORAL_TABLET | Freq: Three times a day (TID) | ORAL | Status: DC | PRN

## 2015-04-27 NOTE — Telephone Encounter (Signed)
Rx to be reprinted and signed by Dr Diona Browner. Rx was not completed by triage, but rerouted to CMA after end of day, and Rx cannot be located

## 2015-04-27 NOTE — Telephone Encounter (Signed)
Spoke to pt and informed her Rx is available for pickup from the front desk. Pt advised third party unable to pickup 

## 2015-04-27 NOTE — Addendum Note (Signed)
Addended by: Modena Nunnery on: 04/27/2015 09:14 AM   Modules accepted: Orders

## 2015-05-17 ENCOUNTER — Encounter: Payer: Self-pay | Admitting: Family Medicine

## 2015-06-07 ENCOUNTER — Other Ambulatory Visit: Payer: Self-pay | Admitting: Family Medicine

## 2015-06-14 ENCOUNTER — Ambulatory Visit (INDEPENDENT_AMBULATORY_CARE_PROVIDER_SITE_OTHER): Payer: Medicare Other | Admitting: Family Medicine

## 2015-06-14 ENCOUNTER — Encounter: Payer: Self-pay | Admitting: Family Medicine

## 2015-06-14 ENCOUNTER — Ambulatory Visit: Payer: Medicare Other | Admitting: Family Medicine

## 2015-06-14 VITALS — BP 142/82 | HR 76 | Temp 98.1°F | Wt 179.2 lb

## 2015-06-14 DIAGNOSIS — R5382 Chronic fatigue, unspecified: Secondary | ICD-10-CM | POA: Diagnosis not present

## 2015-06-14 DIAGNOSIS — M859 Disorder of bone density and structure, unspecified: Secondary | ICD-10-CM | POA: Diagnosis not present

## 2015-06-14 DIAGNOSIS — Z79899 Other long term (current) drug therapy: Secondary | ICD-10-CM

## 2015-06-14 DIAGNOSIS — R5383 Other fatigue: Secondary | ICD-10-CM | POA: Insufficient documentation

## 2015-06-14 LAB — CBC WITH DIFFERENTIAL/PLATELET
BASOS PCT: 0.7 % (ref 0.0–3.0)
Basophils Absolute: 0 10*3/uL (ref 0.0–0.1)
EOS ABS: 0.1 10*3/uL (ref 0.0–0.7)
EOS PCT: 1.2 % (ref 0.0–5.0)
HCT: 42.2 % (ref 36.0–46.0)
HEMOGLOBIN: 13.8 g/dL (ref 12.0–15.0)
LYMPHS ABS: 1.5 10*3/uL (ref 0.7–4.0)
Lymphocytes Relative: 20.7 % (ref 12.0–46.0)
MCHC: 32.6 g/dL (ref 30.0–36.0)
MCV: 89.5 fl (ref 78.0–100.0)
Monocytes Absolute: 0.5 10*3/uL (ref 0.1–1.0)
Monocytes Relative: 7.7 % (ref 3.0–12.0)
NEUTROS PCT: 69.7 % (ref 43.0–77.0)
Neutro Abs: 5 10*3/uL (ref 1.4–7.7)
Platelets: 236 10*3/uL (ref 150.0–400.0)
RBC: 4.72 Mil/uL (ref 3.87–5.11)
RDW: 14.1 % (ref 11.5–15.5)
WBC: 7.1 10*3/uL (ref 4.0–10.5)

## 2015-06-14 LAB — COMPREHENSIVE METABOLIC PANEL
ALT: 11 U/L (ref 0–35)
AST: 13 U/L (ref 0–37)
Albumin: 4.3 g/dL (ref 3.5–5.2)
Alkaline Phosphatase: 52 U/L (ref 39–117)
BUN: 16 mg/dL (ref 6–23)
CHLORIDE: 106 meq/L (ref 96–112)
CO2: 31 mEq/L (ref 19–32)
Calcium: 10.1 mg/dL (ref 8.4–10.5)
Creatinine, Ser: 0.94 mg/dL (ref 0.40–1.20)
GFR: 62.4 mL/min (ref >60.00)
GLUCOSE: 92 mg/dL (ref 70–99)
POTASSIUM: 4.6 meq/L (ref 3.5–5.1)
Sodium: 145 mEq/L (ref 135–145)
Total Bilirubin: 0.5 mg/dL (ref 0.2–1.2)
Total Protein: 6.5 g/dL (ref 6.0–8.3)

## 2015-06-14 LAB — VITAMIN D 25 HYDROXY (VIT D DEFICIENCY, FRACTURES): VITD: 31.81 ng/mL (ref 30.00–100.00)

## 2015-06-14 LAB — TSH: TSH: 3.59 u[IU]/mL (ref 0.35–4.50)

## 2015-06-14 LAB — VITAMIN B12: Vitamin B-12: 731 pg/mL (ref 211–911)

## 2015-06-14 MED ORDER — METHYLPHENIDATE HCL 10 MG PO TABS: 10.0000 mg | ORAL_TABLET | Freq: Three times a day (TID) | ORAL | Status: DC | PRN

## 2015-06-14 NOTE — Progress Notes (Signed)
Pre visit review using our clinic review tool, if applicable. No additional management support is needed unless otherwise documented below in the visit note. 

## 2015-06-14 NOTE — Assessment & Plan Note (Signed)
New-  Likely multifactorial. >25 minutes spent in face to face time with patient, >50% spent in counselling or coordination of care Start with labs today.   The patient indicates understanding of these issues and agrees with the plan. Orders Placed This Encounter  Procedures  . CBC with Differential/Platelet  . Comprehensive metabolic panel  . TSH  . Vitamin B12  . Vitamin D, 25-hydroxy

## 2015-06-14 NOTE — Progress Notes (Signed)
Subjective:   Patient ID: Collene Mares, female    DOB: [birth year], 69 y.o.   MRN: [id number]  Alsie Younes is a pleasant 69 y.o. year old female who presents to clinic today with Fatigue  on 06/14/2015  HPI:  Persistent and progressive fatigue for past 3- 6 months. Used to have to take two of her trazodone tablets to fall asleep at night.  Now often so tired, she does not need to take trazodone to sleep. She is sleeping through the night.  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  Does have a history of depression but feels she is not currently depressed. No SI or HI.  She has had increased stressors lately. She and her daughter are currently not on speaking terms and this has also effected her relationship with her grandchildren.  Denies any CP or SOB. No blood in her stool.   Flex sig 04/25/13- Dr. Hilarie Fredrickson- reviewed.  Lab Results  Component Value Date   TSH 2.38 12/20/2014   Lab Results  Component Value Date   WBC 8.3 12/20/2014   HGB 13.8 12/20/2014   HCT 40.9 12/20/2014   MCV 86.8 12/20/2014   PLT 232.0 12/20/2014   Lab Results  Component Value Date   [id number] 0160* 12/20/2014    Current Outpatient Prescriptions on File Prior to Visit  Medication Sig Dispense Refill  . alendronate (FOSAMAX) 70 MG tablet TAKE ONE TABLET BY MOUTH EVERY 7 DAYS. TAKE WITH FULL GLASS OF WATER ON EMPTY STOMACH 12 tablet 0  . calcium carbonate 200 MG capsule Take 250 mg by mouth 2 (two) times daily with a meal.    . Cholecalciferol (VITAMIN D3) 2000 UNITS capsule Take 2,000 Units by mouth daily.     . Cyanocobalamin (VITAMIN B 12 PO) Take by mouth daily.    Marland Kitchen escitalopram (LEXAPRO) 20 MG tablet TAKE ONE TABLET BY MOUTH ONCE DAILY 30 tablet 2  . gabapentin (NEURONTIN) 300 MG capsule TAKE ONE CAPSULE BY MOUTH TWICE DAILY 180 capsule 2  . Krill Oil 1000 MG CAPS  Take 1,000 mg by mouth daily.    Marland Kitchen lamoTRIgine (LAMICTAL) 200 MG tablet TAKE ONE TABLET BY MOUTH ONCE DAILY 30 tablet 5  . methylphenidate (RITALIN) 10 MG tablet Take 1 tablet (10 mg total) by mouth 3 (three) times daily as needed. 90 tablet 0  . niacin (NIASPAN) 1000 MG CR tablet Take 1 tablet (1,000 mg total) by mouth at bedtime. 90 tablet 3  . omeprazole (PRILOSEC) 40 MG capsule TAKE ONE CAPSULE BY MOUTH TWICE DAILY 180 capsule 2  . simvastatin (ZOCOR) 40 MG tablet Take 1 tablet (40 mg total) by mouth daily. 30 tablet 4  . traZODone (DESYREL) 100 MG tablet Take two tabs by mouth at bedtime 180 tablet 1   No current facility-administered medications on file prior to visit.    Allergies  Allergen Reactions  . Latex     Causes redness and raised areas on skin    Past Medical History  Diagnosis Date  . Mood disorder     NOS  . Fibromyalgia   . GERD (gastroesophageal reflux disease)   . Hyperlipidemia   . IBS (irritable bowel syndrome)   . History of colonic polyps   . History of stroke     ministroke x3  . Imbalance 11/2012  s/p PT at [facility]  . Arthritis   . Osteoporosis   . CRVO (central retinal vein occlusion)     Left  . Stroke     TIA  . Cataract     right  . Depression   . Thyroid disease     Past Surgical History  Procedure Laterality Date  . Thyroidectomy  2007    partial   . Cosmetic surgery    . Cholecystectomy  1995  . Thumb arthroscopy      left  . Rectal ultrasound N/A 05/16/2013    Procedure: RECTAL ULTRASOUND;  Surgeon: Leighton Ruff, MD;  Location: [facility];  Service: Endoscopy;  Laterality: N/A;  REQUEST FEMALE NURSE AND TECH  . Anal rectal manometry N/A 05/16/2013    Procedure: ANAL RECTAL MANOMETRY;  Surgeon: Leighton Ruff, MD;  [facility];  Service: Endoscopy;  Laterality: N/A;    Family History  Problem Relation Age of Onset  . Stroke Mother   . Stroke Father   . Heart disease Brother   . Cancer Brother   . Colon  cancer Neg Hx   . Esophageal cancer Neg Hx   . Rectal cancer Neg Hx   . Stomach cancer Neg Hx     Social History   Social History  . Marital Status: Single    Spouse Name: N/A  . Number of Children: 5  . Years of Education: Masters +   Occupational History  . Retired Education officer, museum    Social History Main Topics  . Smoking status: Former Smoker    Types: Cigarettes    Quit date: 10/06/1974  . Smokeless tobacco: Never Used  . Alcohol Use: 0.0 oz/week    0 Standard drinks or equivalent per week     Comment: Rare  . Drug Use: No  . Sexual Activity: Not on file   Other Topics Concern  . Not on file   Social History Narrative   The PMH, PSH, Social History, Family History, Medications, and allergies have been reviewed in [facility], and have been updated if relevant.  Review of Systems  Constitutional: Positive for fatigue. Negative for fever and unexpected weight change.  HENT: Negative.   Eyes: Negative.   Respiratory: Negative.   Cardiovascular: Negative.   Gastrointestinal: Negative.   Endocrine: Negative.   Genitourinary: Negative.   Musculoskeletal: Negative.   Skin: Negative.   Allergic/Immunologic: Negative.   Neurological: Negative.   Hematological: Negative.   Psychiatric/Behavioral: Negative.   All other systems reviewed and are negative.      Objective:    BP 142/82 mmHg  Pulse 76  Temp(Src) 98.1 F (36.7 C) (Oral)  Wt 179 lb 4 oz (81.307 kg)  SpO2 95% Wt Readings from Last 3 Encounters:  06/14/15 179 lb 4 oz (81.307 kg)  12/20/14 189 lb (85.73 kg)  05/24/14 191 lb (86.637 kg)     Physical Exam  Constitutional: She is oriented to person, place, and time. She appears well-developed and well-nourished. No distress.  HENT:  Head: Normocephalic and atraumatic.  Neck: Normal range of motion. Neck supple. No thyromegaly present.  Cardiovascular: Normal rate.   Pulmonary/Chest: Effort normal.  Musculoskeletal: Normal range of motion. She exhibits no  edema.  Neurological: She is alert and oriented to person, place, and time. No cranial nerve deficit.  Skin: Skin is warm and dry. She is not diaphoretic.  Psychiatric: She has a normal mood and affect. Her behavior is normal. Judgment and thought content normal.  Nursing note and vitals reviewed.         Assessment & Plan:   Chronic fatigue No Follow-up on file.

## 2015-07-16 ENCOUNTER — Other Ambulatory Visit: Payer: Self-pay | Admitting: Family Medicine

## 2015-07-18 ENCOUNTER — Other Ambulatory Visit: Payer: Self-pay | Admitting: Family Medicine

## 2015-07-18 NOTE — Telephone Encounter (Signed)
Last f/u 12/2014 

## 2015-07-23 ENCOUNTER — Other Ambulatory Visit: Payer: Self-pay

## 2015-07-23 MED ORDER — METHYLPHENIDATE HCL 10 MG PO TABS: 10.0000 mg | ORAL_TABLET | Freq: Three times a day (TID) | ORAL | Status: DC | PRN

## 2015-07-23 NOTE — Telephone Encounter (Signed)
Lm on pts vm and informed her Rx is available for pickup from the front desk 

## 2015-07-23 NOTE — Telephone Encounter (Signed)
Pt left v/m requesting rx methylphenidate. Call when ready for pick up. rx last printed # 90 on 06/14/15. Pt last seen med refill visit on 12/20/14 and last acute visit 06/14/15.

## 2015-07-26 ENCOUNTER — Ambulatory Visit (INDEPENDENT_AMBULATORY_CARE_PROVIDER_SITE_OTHER): Payer: Medicare Other

## 2015-07-26 DIAGNOSIS — Z23 Encounter for immunization: Secondary | ICD-10-CM | POA: Diagnosis not present

## 2015-07-31 ENCOUNTER — Telehealth: Payer: Self-pay | Admitting: Cardiovascular Disease

## 2015-07-31 NOTE — Telephone Encounter (Signed)
3 attempts made to schedule OD fu appts .    LMOV to schedule with office    Deleted Recall.

## 2015-08-23 ENCOUNTER — Other Ambulatory Visit: Payer: Self-pay

## 2015-08-23 MED ORDER — METHYLPHENIDATE HCL 10 MG PO TABS: 10.0000 mg | ORAL_TABLET | Freq: Three times a day (TID) | ORAL | Status: DC | PRN

## 2015-08-23 NOTE — Telephone Encounter (Signed)
Spoke to pt and informed her Rx is available for pickup from the front desk 

## 2015-08-23 NOTE — Telephone Encounter (Signed)
Dr. Deborra Medina last note reviewed. RX printed and signed and given to [facility]

## 2015-08-23 NOTE — Telephone Encounter (Signed)
Pt left v/m requesting rx methylphenidate. Call when ready for pick up. rx last printed # 90 on 07/23/15. Last f/u appt on 12/20/14. Dr Deborra Medina out of office until 08/27/15.

## 2015-08-27 ENCOUNTER — Ambulatory Visit (INDEPENDENT_AMBULATORY_CARE_PROVIDER_SITE_OTHER): Payer: Medicare Other | Admitting: Family Medicine

## 2015-08-27 ENCOUNTER — Encounter: Payer: Self-pay | Admitting: Family Medicine

## 2015-08-27 ENCOUNTER — Ambulatory Visit (INDEPENDENT_AMBULATORY_CARE_PROVIDER_SITE_OTHER)
Admission: RE | Admit: 2015-08-27 | Discharge: 2015-08-27 | Disposition: A | Discharge status: Home / Self Care | Payer: Medicare Other | Source: Ambulatory Visit | Attending: Family Medicine | Admitting: Family Medicine

## 2015-08-27 VITALS — BP 138/72 | HR 84 | Temp 98.3°F | Wt 179.0 lb

## 2015-08-27 DIAGNOSIS — M79641 Pain in right hand: Secondary | ICD-10-CM | POA: Diagnosis not present

## 2015-08-27 DIAGNOSIS — M19041 Primary osteoarthritis, right hand: Secondary | ICD-10-CM | POA: Diagnosis not present

## 2015-08-27 NOTE — Progress Notes (Signed)
Subjective:   Patient ID: Nicole Perkins, female    DOB: 1946-01-02, 69 y.o.   MRN: [id number]  Nicole Perkins is a pleasant 69 y.o. year old female who presents to clinic today with Hand Pain  on 08/27/2015  HPI:  Right hand pain/tingling- pain since she fell over the summer, now "pads" of hands and fingers tender to touch.  Feels they are "throbbing" and tingling.  Driving is the position that seems to make it feel worse.  No redness or warmth.  Current Outpatient Prescriptions on File Prior to Visit  Medication Sig Dispense Refill  . alendronate (FOSAMAX) 70 MG tablet TAKE ONE TABLET BY MOUTH EVERY 7 DAYS. TAKE WITH FULL GLASS OF WATER ON EMPTY STOMACH 12 tablet 2  . calcium carbonate 200 MG capsule Take 250 mg by mouth 2 (two) times daily with a meal.    . Cholecalciferol (VITAMIN D3) 2000 UNITS capsule Take 2,000 Units by mouth daily.     . Cyanocobalamin (VITAMIN B 12 PO) Take by mouth daily.    Marland Kitchen escitalopram (LEXAPRO) 20 MG tablet TAKE ONE TABLET BY MOUTH ONCE DAILY 30 tablet 6  . gabapentin (NEURONTIN) 300 MG capsule TAKE ONE CAPSULE BY MOUTH TWICE DAILY 180 capsule 2  . Krill Oil 1000 MG CAPS Take 1,000 mg by mouth daily.    Marland Kitchen lamoTRIgine (LAMICTAL) 200 MG tablet TAKE ONE TABLET BY MOUTH ONCE DAILY 30 tablet 5  . methylphenidate (RITALIN) 10 MG tablet Take 1 tablet (10 mg total) by mouth 3 (three) times daily as needed. 90 tablet 0  . niacin (NIASPAN) 1000 MG CR tablet Take 1 tablet (1,000 mg total) by mouth at bedtime. 90 tablet 3  . omeprazole (PRILOSEC) 40 MG capsule TAKE ONE CAPSULE BY MOUTH TWICE DAILY 180 capsule 2  . simvastatin (ZOCOR) 40 MG tablet Take 1 tablet (40 mg total) by mouth daily. 30 tablet 4  . traZODone (DESYREL) 100 MG tablet Take two tabs by mouth at bedtime 180 tablet 1   No current facility-administered medications on file prior to visit.    Allergies  Allergen Reactions  . Latex     Causes redness and raised areas on skin    Past Medical  History  Diagnosis Date  . Mood disorder (HCC)     NOS  . Fibromyalgia   . GERD (gastroesophageal reflux disease)   . Hyperlipidemia   . IBS (irritable bowel syndrome)   . History of colonic polyps   . History of stroke     ministroke x3  . Imbalance 11/2012    s/p PT at [facility]  . Arthritis   . Osteoporosis   . CRVO (central retinal vein occlusion)     Left  . Stroke [facility])     TIA  . Cataract     right  . Depression   . Thyroid disease     Past Surgical History  Procedure Laterality Date  . Thyroidectomy  2007    partial   . Cosmetic surgery    . Cholecystectomy  1995  . Thumb arthroscopy      left  . Rectal ultrasound N/A 05/16/2013    Procedure: RECTAL ULTRASOUND;  Surgeon: Leighton Ruff, MD;  [facility]: [facility];  Service: Endoscopy;  Laterality: N/A;  REQUEST FEMALE NURSE AND TECH  . Anal rectal manometry N/A 05/16/2013    Procedure: ANAL RECTAL MANOMETRY;  Surgeon: Leighton Ruff, MD;  [facility];  Service: Endoscopy;  Laterality: N/A;  Family History  Problem Relation Age of Onset  . Stroke Mother   . Stroke Father   . Heart disease Brother   . Cancer Brother   . Colon cancer Neg Hx   . Esophageal cancer Neg Hx   . Rectal cancer Neg Hx   . Stomach cancer Neg Hx     Social History   Social History  . Marital Status: Single    Spouse Name: N/A  . Number of Children: 5  . Years of Education: Masters +   Occupational History  . Retired Education officer, museum    Social History Main Topics  . Smoking status: Former Smoker    Types: Cigarettes    Quit date: 10/06/1974  . Smokeless tobacco: Never Used  . Alcohol Use: 0.0 oz/week    0 Standard drinks or equivalent per week     Comment: Rare  . Drug Use: No  . Sexual Activity: Not on file   Other Topics Concern  . Not on file   Social History Narrative   The PMH, PSH, Social History, Family History, Medications, and allergies have been reviewed in [facility], and have been updated if  relevant.   Review of Systems  Constitutional: Negative.   HENT: Negative.   Eyes: Negative.   Genitourinary: Negative.   Musculoskeletal: Positive for arthralgias.  Skin: Negative.   Neurological: Negative.   Hematological: Negative.   All other systems reviewed and are negative.      Objective:    BP 138/72 mmHg  Pulse 84  Temp(Src) 98.3 F (36.8 C) (Oral)  Wt 179 lb (81.194 kg)  SpO2 96%   Physical Exam  Constitutional: She is oriented to person, place, and time. She appears well-developed and well-nourished. No distress.  HENT:  Head: Normocephalic.  Eyes: Conjunctivae are normal.  Cardiovascular: Normal rate.   Pulmonary/Chest: Effort normal.  Musculoskeletal:       Arms: Neurological: She is alert and oriented to person, place, and time.  Skin: Skin is warm and dry.  Psychiatric: She has a normal mood and affect. Her behavior is normal. Thought content normal.  Nursing note and vitals reviewed.         Assessment & Plan:   Right hand pain - Plan: DG Hand Complete Right, Ambulatory referral to Hand Surgery No Follow-up on file.

## 2015-08-27 NOTE — Assessment & Plan Note (Signed)
New and progressive. Ongoing for months and characteristics of nerve involvement along with OA, refer to hand surgeon. Xray here today first. The patient indicates understanding of these issues and agrees with the plan.

## 2015-08-27 NOTE — Progress Notes (Signed)
Pre visit review using our clinic review tool, if applicable. No additional management support is needed unless otherwise documented below in the visit note. 

## 2015-08-27 NOTE — Patient Instructions (Signed)
Great to see you. After your hand xray, please stop by to see Rosaria Ferries on your way out.

## 2015-09-04 ENCOUNTER — Other Ambulatory Visit: Payer: Self-pay | Admitting: Family Medicine

## 2015-09-04 DIAGNOSIS — M65341 Trigger finger, right ring finger: Secondary | ICD-10-CM | POA: Diagnosis not present

## 2015-09-05 ENCOUNTER — Other Ambulatory Visit: Payer: Self-pay | Admitting: Family Medicine

## 2015-09-10 ENCOUNTER — Encounter: Payer: Self-pay | Admitting: Family Medicine

## 2015-09-11 ENCOUNTER — Telehealth: Payer: Self-pay | Admitting: Family Medicine

## 2015-09-11 DIAGNOSIS — M810 Age-related osteoporosis without current pathological fracture: Secondary | ICD-10-CM

## 2015-09-11 NOTE — Telephone Encounter (Signed)
Please call pt to help her schedule her DEXA.

## 2015-09-12 ENCOUNTER — Encounter: Payer: Self-pay | Admitting: Family Medicine

## 2015-09-12 NOTE — Telephone Encounter (Signed)
Robin, please advise.

## 2015-09-12 NOTE — Telephone Encounter (Signed)
Bone density printed and faxed as requested

## 2015-09-18 DIAGNOSIS — M85862 Other specified disorders of bone density and structure, left lower leg: Secondary | ICD-10-CM | POA: Diagnosis not present

## 2015-09-18 DIAGNOSIS — M810 Age-related osteoporosis without current pathological fracture: Secondary | ICD-10-CM | POA: Diagnosis not present

## 2015-09-20 ENCOUNTER — Encounter: Payer: Self-pay | Admitting: Family Medicine

## 2015-09-20 ENCOUNTER — Encounter: Payer: Self-pay | Admitting: *Deleted

## 2015-09-21 ENCOUNTER — Encounter: Payer: Self-pay | Admitting: Family Medicine

## 2015-09-21 ENCOUNTER — Other Ambulatory Visit: Payer: Self-pay | Admitting: Internal Medicine

## 2015-09-21 MED ORDER — METHYLPHENIDATE HCL 10 MG PO TABS: 10.0000 mg | ORAL_TABLET | Freq: Three times a day (TID) | ORAL | Status: DC | PRN

## 2015-09-21 NOTE — Telephone Encounter (Signed)
Spoke to pt and informed her Rx is available for pickup from the front desk 

## 2015-09-21 NOTE — Telephone Encounter (Signed)
RX printed and signed and given to waynetta 

## 2015-10-04 ENCOUNTER — Encounter: Payer: Self-pay | Admitting: *Deleted

## 2015-10-12 DIAGNOSIS — Z1231 Encounter for screening mammogram for malignant neoplasm of breast: Secondary | ICD-10-CM | POA: Diagnosis not present

## 2015-10-15 ENCOUNTER — Encounter: Payer: Self-pay | Admitting: Family Medicine

## 2015-10-18 ENCOUNTER — Other Ambulatory Visit: Payer: Self-pay

## 2015-10-18 MED ORDER — METHYLPHENIDATE HCL 10 MG PO TABS: 10.0000 mg | ORAL_TABLET | Freq: Three times a day (TID) | ORAL | Status: DC | PRN

## 2015-10-18 NOTE — Telephone Encounter (Signed)
Pt left /vm requesting rx methylphenidate. Call when ready for pick up. Pt request to pick up on 10/19/15. Last printed # 90 on 09/21/15 and last seen f/u on 12/20/14. Dr Deborra Medina out of office and sending to Dr Silvio Pate.

## 2015-10-18 NOTE — Telephone Encounter (Signed)
I will do the refill. Forward to Dr Deborra Medina to see if she needs follow up appt (none scheduled)

## 2015-10-19 NOTE — Telephone Encounter (Signed)
Spoke with patient and advised rx ready for pick-up and it will be at the front desk.  

## 2015-10-25 ENCOUNTER — Other Ambulatory Visit: Payer: Self-pay | Admitting: Family Medicine

## 2015-10-29 DIAGNOSIS — M545 Low back pain: Secondary | ICD-10-CM | POA: Diagnosis not present

## 2015-10-29 DIAGNOSIS — Z6830 Body mass index (BMI) 30.0-30.9, adult: Secondary | ICD-10-CM | POA: Diagnosis not present

## 2015-11-29 ENCOUNTER — Other Ambulatory Visit: Payer: Self-pay

## 2015-11-29 ENCOUNTER — Encounter: Payer: Self-pay | Admitting: Family Medicine

## 2015-11-29 DIAGNOSIS — Z79899 Other long term (current) drug therapy: Secondary | ICD-10-CM | POA: Diagnosis not present

## 2015-11-29 MED ORDER — METHYLPHENIDATE HCL 10 MG PO TABS: 10.0000 mg | ORAL_TABLET | Freq: Three times a day (TID) | ORAL | Status: DC | PRN

## 2015-11-29 NOTE — Telephone Encounter (Signed)
Pt left v/m requesting rx methylphenidate. Call when ready for pick up. Last printed # 90 on 10/18/15; last f/u appt 12/20/14. Pt request to pick up rx this afternoon. No future appt scheduled.

## 2015-12-14 ENCOUNTER — Encounter: Payer: Self-pay | Admitting: Family Medicine

## 2015-12-25 ENCOUNTER — Other Ambulatory Visit: Payer: Self-pay | Admitting: Family Medicine

## 2016-01-04 ENCOUNTER — Other Ambulatory Visit: Payer: Self-pay | Admitting: Family Medicine

## 2016-01-04 NOTE — Telephone Encounter (Signed)
Is pt needing to continue? pls advise

## 2016-01-16 ENCOUNTER — Ambulatory Visit (INDEPENDENT_AMBULATORY_CARE_PROVIDER_SITE_OTHER): Payer: Medicare Other

## 2016-01-16 VITALS — BP 110/68 | HR 69 | Temp 98.4°F | Ht 64.0 in | Wt 177.0 lb

## 2016-01-16 DIAGNOSIS — Z1159 Encounter for screening for other viral diseases: Secondary | ICD-10-CM | POA: Diagnosis not present

## 2016-01-16 DIAGNOSIS — Z0000 Encounter for general adult medical examination without abnormal findings: Secondary | ICD-10-CM | POA: Diagnosis not present

## 2016-01-16 DIAGNOSIS — F909 Attention-deficit hyperactivity disorder, unspecified type: Secondary | ICD-10-CM

## 2016-01-16 MED ORDER — METHYLPHENIDATE HCL 10 MG PO TABS: 10.0000 mg | ORAL_TABLET | Freq: Three times a day (TID) | ORAL | Status: DC | PRN

## 2016-01-16 NOTE — Progress Notes (Signed)
I reviewed health advisor's note, was available for consultation, and agree with documentation and plan.  

## 2016-01-16 NOTE — Progress Notes (Signed)
Pre visit review using our clinic review tool, if applicable. No additional management support is needed unless otherwise documented below in the visit note. 

## 2016-01-16 NOTE — Patient Instructions (Addendum)
Nicole Perkins , Thank you for taking time to come for your Medicare Wellness Visit. I appreciate your ongoing commitment to your health goals. Please review the following plan we discussed and let me know if I can assist you in the future.   These are the goals we discussed: Goals    . 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        This is a list of the screening recommended for you and due dates:  Health Maintenance  Topic Date Due  . Flu Shot  05/06/2016  . Mammogram  10/11/2016  . Tetanus Vaccine  10/15/2020  . Colon Cancer Screening  12/20/2020  . DEXA scan (bone density measurement)  Completed  . Shingles Vaccine  Completed  .  Hepatitis C: One time screening is recommended by Center for Disease Control  (CDC) for  adults born from [birth year] through 1967.   Completed  . Pneumonia vaccines  Completed   Preventive Care for Adults  A healthy lifestyle and preventive care can promote health and wellness. Preventive health guidelines for adults include the following key practices.  . A routine yearly physical is a good way to check with your health care provider about your health and preventive screening. It is a chance to share any concerns and updates on your health and to receive a thorough exam.  . Visit your dentist for a routine exam and preventive care every 6 months. Brush your teeth twice a day and floss once a day. Good oral hygiene prevents tooth decay and gum disease.  . The frequency of eye exams is based on your age, health, family medical history, use  of contact lenses, and other factors. Follow your health care provider's ecommendations for frequency of eye exams.  . Eat a healthy diet. Foods like vegetables, fruits, whole grains, low-fat dairy products, and lean protein foods contain the nutrients you need without too many calories. Decrease your intake  of foods high in solid fats, added sugars, and salt. Eat the right amount of calories for you. Get information about a proper diet from your health care provider, if necessary.  . Regular physical exercise is one of the most important things you can do for your health. Most adults should get at least 150 minutes of moderate-intensity exercise (any activity that increases your heart rate and causes you to sweat) each week. In addition, most adults need muscle-strengthening exercises on 2 or more days a week.  Silver Sneakers may be a benefit available to you. To determine eligibility, you may visit the website: www.silversneakers.com or contact program at (929)157-3171 Mon-Fri between 8AM-8PM.   . Maintain a healthy weight. The body mass index (BMI) is a screening tool to identify possible weight problems. It provides an estimate of body fat based on height and weight. Your health care provider can find your BMI and can help you achieve or maintain a healthy weight.   For adults 20 years and older: ? A BMI below 18.5 is considered underweight. ? A BMI of 18.5 to 24.9 is normal. ? A BMI of 25 to 29.9 is considered overweight. ? A BMI of 30 and above is considered obese.   . Maintain normal blood lipids and cholesterol levels by exercising and minimizing your intake of saturated fat. Eat a balanced diet with plenty of fruit and vegetables. Blood tests for lipids  and cholesterol should begin at age 77 and be repeated every 5 years. If your lipid or cholesterol levels are high, you are over 50, or you are at high risk for heart disease, you may need your cholesterol levels checked more frequently. Ongoing high lipid and cholesterol levels should be treated with medicines if diet and exercise are not working.  . If you smoke, find out from your health care provider how to quit. If you do not use tobacco, please do not start.  . If you choose to drink alcohol, please do not consume more than 2 drinks  per day. One drink is considered to be 12 ounces (355 mL) of beer, 5 ounces (148 mL) of wine, or 1.5 ounces (44 mL) of liquor.  . If you are 60-60 years old, ask your health care provider if you should take aspirin to prevent strokes.  . Use sunscreen. Apply sunscreen liberally and repeatedly throughout the day. You should seek shade when your shadow is shorter than you. Protect yourself by wearing long sleeves, pants, a wide-brimmed hat, and sunglasses year round, whenever you are outdoors.  . Once a month, do a whole body skin exam, using a mirror to look at the skin on your back. Tell your health care provider of new moles, moles that have irregular borders, moles that are larger than a pencil eraser, or moles that have changed in shape or color.     Fall Prevention in the Home  Falls can cause injuries. They can happen to people of all ages. There are many things you can do to make your home safe and to help prevent falls.  WHAT CAN I DO ON THE OUTSIDE OF MY HOME?  Regularly fix the edges of walkways and driveways and fix any cracks.  Remove anything that might make you trip as you walk through a door, such as a raised step or threshold.  Trim any bushes or trees on the path to your home.  Use bright outdoor lighting.  Clear any walking paths of anything that might make someone trip, such as rocks or tools.  Regularly check to see if handrails are loose or broken. Make sure that both sides of any steps have handrails.  Any raised decks and porches should have guardrails on the edges.  Have any leaves, snow, or ice cleared regularly.  Use sand or salt on walking paths during winter.  Clean up any spills in your garage right away. This includes oil or grease spills. WHAT CAN I DO IN THE BATHROOM?   Use night lights.  Install grab bars by the toilet and in the tub and shower. Do not use towel bars as grab bars.  Use non-skid mats or decals in the tub or shower.  If you need  to sit down in the shower, use a plastic, non-slip stool.  Keep the floor dry. Clean up any water that spills on the floor as soon as it happens.  Remove soap buildup in the tub or shower regularly.  Attach bath mats securely with double-sided non-slip rug tape.  Do not have throw rugs and other things on the floor that can make you trip. WHAT CAN I DO IN THE BEDROOM?  Use night lights.  Make sure that you have a light by your bed that is easy to reach.  Do not use any sheets or blankets that are too big for your bed. They should not hang down onto the floor.  Have a firm  chair that has side arms. You can use this for support while you get dressed.  Do not have throw rugs and other things on the floor that can make you trip. WHAT CAN I DO IN THE KITCHEN?  Clean up any spills right away.  Avoid walking on wet floors.  Keep items that you use a lot in easy-to-reach places.  If you need to reach something above you, use a strong step stool that has a grab bar.  Keep electrical cords out of the way.  Do not use floor polish or wax that makes floors slippery. If you must use wax, use non-skid floor wax.  Do not have throw rugs and other things on the floor that can make you trip. WHAT CAN I DO WITH MY STAIRS?  Do not leave any items on the stairs.  Make sure that there are handrails on both sides of the stairs and use them. Fix handrails that are broken or loose. Make sure that handrails are as long as the stairways.  Check any carpeting to make sure that it is firmly attached to the stairs. Fix any carpet that is loose or worn.  Avoid having throw rugs at the top or bottom of the stairs. If you do have throw rugs, attach them to the floor with carpet tape.  Make sure that you have a light switch at the top of the stairs and the bottom of the stairs. If you do not have them, ask someone to add them for you. WHAT ELSE CAN I DO TO HELP PREVENT FALLS?  Wear shoes that:  Do  not have high heels.  Have rubber bottoms.  Are comfortable and fit you well.  Are closed at the toe. Do not wear sandals.  If you use a stepladder:  Make sure that it is fully opened. Do not climb a closed stepladder.  Make sure that both sides of the stepladder are locked into place.  Ask someone to hold it for you, if possible.  Clearly mark and make sure that you can see:  Any grab bars or handrails.  First and last steps.  Where the edge of each step is.  Use tools that help you move around (mobility aids) if they are needed. These include:  Canes.  Walkers.  Scooters.  Crutches.  Turn on the lights when you go into a dark area. Replace any light bulbs as soon as they burn out.  Set up your furniture so you have a clear path. Avoid moving your furniture around.  If any of your floors are uneven, fix them.  If there are any pets around you, be aware of where they are.  Review your medicines with your doctor. Some medicines can make you feel dizzy. This can increase your chance of falling. Ask your doctor what other things that you can do to help prevent falls.   This information is not intended to replace advice given to you by your health care provider. Make sure you discuss any questions you have with your health care provider.   Document Released: 07/19/2009 Document Revised: 02/06/2015 Document Reviewed: 10/27/2014 Elsevier Interactive Patient Education [employer].

## 2016-01-16 NOTE — Progress Notes (Signed)
Subjective:   Nicole Perkins is a 70 y.o. female who presents for Medicare Annual (Subsequent) preventive examination.  Cardiac Risk Factors include: advanced age (>91men, >89 women);dyslipidemia;obesity (BMI >30kg/m2)     Objective:     Vitals: BP 110/68 mmHg  Pulse 69  Temp(Src) 98.4 F (36.9 C) (Oral)  Ht 5\' 4"  (1.626 m)  Wt 177 lb (80.287 kg)  BMI 30.37 kg/m2  SpO2 95%  Body mass index is 30.37 kg/(m^2).   Tobacco History  Smoking status  . Former Smoker  . Types: Cigarettes  . Quit date: 10/06/1974  Smokeless tobacco  . Never Used     Counseling given: No   Past Medical History  Diagnosis Date  . Mood disorder (HCC)     NOS  . Fibromyalgia   . GERD (gastroesophageal reflux disease)   . Hyperlipidemia   . IBS (irritable bowel syndrome)   . History of colonic polyps   . History of stroke     ministroke x3  . Imbalance 11/2012    s/p PT at [facility]  . Arthritis   . Osteoporosis   . CRVO (central retinal vein occlusion)     Left  . Stroke [facility])     TIA  . Cataract     right  . Depression   . Thyroid disease    Past Surgical History  Procedure Laterality Date  . Thyroidectomy  2007    partial   . Cosmetic surgery    . Cholecystectomy  1995  . Thumb arthroscopy      left  . Rectal ultrasound N/A 05/16/2013    Procedure: RECTAL ULTRASOUND;  Surgeon: Leighton Ruff, MD;  Location: [facility];  Service: Endoscopy;  Laterality: N/A;  REQUEST FEMALE NURSE AND TECH  . Anal rectal manometry N/A 05/16/2013    Procedure: ANAL RECTAL MANOMETRY;  Surgeon: Leighton Ruff, MD;  Location: [facility];  Service: Endoscopy;  Laterality: N/A;  . Cataract extraction, bilateral Bilateral 07/20/2014  . Foot fracture surgery  2016    5th metarsal and cuboid    Family History  Problem Relation Age of Onset  . Stroke Mother   . Stroke Father   . Heart disease Brother   . Cancer Brother   . Colon cancer Neg Hx   . Esophageal cancer Neg Hx   . Rectal cancer  Neg Hx   . Stomach cancer Neg Hx    History  Sexual Activity  . Sexual Activity: No    Outpatient Encounter Prescriptions as of 01/16/2016  Medication Sig  . alendronate (FOSAMAX) 70 MG tablet TAKE ONE TABLET BY MOUTH EVERY 7 DAYS. TAKE WITH FULL GLASS OF WATER ON EMPTY STOMACH  . calcium carbonate 200 MG capsule Take 250 mg by mouth 2 (two) times daily with a meal.  . Cholecalciferol (VITAMIN D3) 2000 UNITS capsule Take 2,000 Units by mouth daily.   . Cyanocobalamin (VITAMIN B 12 PO) Take by mouth daily.  Marland Kitchen escitalopram (LEXAPRO) 20 MG tablet TAKE ONE TABLET BY MOUTH ONCE DAILY  . Krill Oil 1000 MG CAPS Take 1,000 mg by mouth daily.  Marland Kitchen lamoTRIgine (LAMICTAL) 200 MG tablet TAKE ONE TABLET BY MOUTH ONCE DAILY  . methylphenidate (RITALIN) 10 MG tablet Take 1 tablet (10 mg total) by mouth 3 (three) times daily as needed.  . niacin (NIASPAN) 1000 MG CR tablet Take 1 tablet (1,000 mg total) by mouth at bedtime.  Marland Kitchen omeprazole (PRILOSEC) 40 MG capsule TAKE ONE CAPSULE BY MOUTH TWICE DAILY  .  simvastatin (ZOCOR) 40 MG tablet TAKE ONE TABLET BY MOUTH ONCE DAILY  . traZODone (DESYREL) 100 MG tablet TAKE TWO TABLETS BY MOUTH AT BEDTIME  . [DISCONTINUED] methylphenidate (RITALIN) 10 MG tablet Take 1 tablet (10 mg total) by mouth 3 (three) times daily as needed.  . [DISCONTINUED] gabapentin (NEURONTIN) 300 MG capsule TAKE ONE CAPSULE BY MOUTH TWICE DAILY   No facility-administered encounter medications on file as of 01/16/2016.    Activities of Daily Living In your present state of health, do you have any difficulty performing the following activities: 01/16/2016  Hearing? Y  Vision? N  Difficulty concentrating or making decisions? N  Walking or climbing stairs? N  Dressing or bathing? N  Doing errands, shopping? N  Preparing Food and eating ? N  Using the Toilet? N  In the past six months, have you accidently leaked urine? N  Do you have problems with loss of bowel control? Y  Managing your  Medications? N  Managing your Finances? N  Housekeeping or managing your Housekeeping? N    Patient Care Team: Lucille Passy, MD as PCP - General    Assessment:     Hearing Screening   125Hz  250Hz  500Hz  1000Hz  2000Hz  4000Hz  8000Hz   Right ear:   0 0 40 40   Left ear:   40 0 40 0   Vision Screening Comments: Last eye exam in 07/2015. Also had cataract surgery during this time period.    Exercise Activities and Dietary recommendations Current Exercise Habits: The patient does not participate in regular exercise at present, Exercise limited by: None identified  Goals    . 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       Fall Risk Fall Risk  01/16/2016  Falls in the past year? Yes  Number falls in past yr: 1  Injury with Fall? Yes  Risk Factor Category  High Fall Risk  Risk for fall due to : History of fall(s)  Follow up Falls evaluation completed;Education provided   Depression Screen PHQ 2/9 Scores 01/16/2016 06/15/2012  PHQ - 2 Score 0 0     Cognitive Testing MMSE - Mini Mental State Exam 01/16/2016  Orientation to time 5  Orientation to Place 5  Registration 3  Attention/ Calculation 0  Recall 3  Language- name 2 objects 0  Language- repeat 1  Language- follow 3 step command 3  Language- read & follow direction 0  Write a sentence 0  Copy design 0  Total score 20   PLEASE NOTE: A Mini-Cog screen was completed. Maximum score is 20. A value of 0 denotes this part of Folstein MMSE was not completed.  Orientation to Time - Max 5 Orientation to Place - Max 5 Registration - Max 3 Recall - Max 3 Language Repeat - Max 1 Language Follow 3 Step Command - Max 3   Immunization History  Administered Date(s) Administered  . Hep A / Hep B 09/12/2010, 10/15/2010, 04/30/2011  . Influenza Split 06/23/2011  . Influenza,inj,Quad PF,36+ Mos 06/23/2013,  07/26/2015  . Influenza-Unspecified 07/20/2014  . Pneumococcal Conjugate-13 12/20/2014  . Pneumococcal Polysaccharide-23 11/22/2008, 09/12/2010  . Td 09/12/2010, 10/15/2010  . Zoster 10/15/2010   Screening Tests Health Maintenance  Topic Date Due  . INFLUENZA VACCINE  05/06/2016  . MAMMOGRAM  10/11/2016  . TETANUS/TDAP  10/15/2020  . COLONOSCOPY  12/20/2020  . DEXA  SCAN  Completed  . ZOSTAVAX  Completed  . Hepatitis C Screening  Completed  . PNA vac Low Risk Adult  Completed      Plan:      I have personally reviewed and addressed the Medicare Annual Wellness questionnaire and have noted the following in the patient's chart:  A. Medical and social history B. Use of alcohol, tobacco or illicit drugs  C. Current medications and supplements D. Functional ability and status E.  Nutritional status F.  Physical activity G. Advance directives H. List of other physicians I.  Hospitalizations, surgeries, and ER visits in previous 12 months J.  [place] to include hearing, vision, cognitive, depression L. Referrals and appointments - none  In addition, I have reviewed and discussed with patient certain preventive protocols, quality metrics, and best practice recommendations. A written personalized care plan for preventive services as well as general preventive health recommendations were provided to patient.  See attached scanned questionnaire for additional information.   Signed,   Lindell Noe, MHA, BS, LPN Health Advisor [id number]

## 2016-01-17 LAB — HEPATITIS C ANTIBODY: HCV AB: NEGATIVE

## 2016-02-19 ENCOUNTER — Other Ambulatory Visit: Payer: Self-pay | Admitting: Family Medicine

## 2016-02-22 ENCOUNTER — Other Ambulatory Visit: Payer: Self-pay | Admitting: Family Medicine

## 2016-02-22 NOTE — Telephone Encounter (Signed)
Last f/u 12/2014 

## 2016-03-12 ENCOUNTER — Other Ambulatory Visit: Payer: Self-pay

## 2016-03-12 DIAGNOSIS — F909 Attention-deficit hyperactivity disorder, unspecified type: Secondary | ICD-10-CM

## 2016-03-12 NOTE — Telephone Encounter (Signed)
Pt left v/m requesting rx methylphenidate. Call when ready for pick up. Pt request to pick up on 03/13/16. Last printed # 90 on 01/16/16. Pt last seen 12/20/14 for F/U. appt scheduled 06/16/16.

## 2016-03-12 NOTE — Telephone Encounter (Signed)
Pt has not been seen since 12/2014. This will have to wait until Dr. Hulen Shouts return on Monday.

## 2016-03-13 NOTE — Telephone Encounter (Signed)
waynette please call pt about rx Best number [phone]

## 2016-03-13 NOTE — Telephone Encounter (Signed)
Lm on pts vm requesting a call back 

## 2016-03-19 ENCOUNTER — Ambulatory Visit (INDEPENDENT_AMBULATORY_CARE_PROVIDER_SITE_OTHER): Payer: Medicare Other | Admitting: Family Medicine

## 2016-03-19 ENCOUNTER — Encounter: Payer: Self-pay | Admitting: Family Medicine

## 2016-03-19 ENCOUNTER — Other Ambulatory Visit: Payer: Self-pay | Admitting: Family Medicine

## 2016-03-19 VITALS — BP 114/62 | HR 64 | Temp 97.8°F | Wt 178.8 lb

## 2016-03-19 DIAGNOSIS — F900 Attention-deficit hyperactivity disorder, predominantly inattentive type: Secondary | ICD-10-CM | POA: Diagnosis not present

## 2016-03-19 DIAGNOSIS — F39 Unspecified mood [affective] disorder: Secondary | ICD-10-CM

## 2016-03-19 DIAGNOSIS — E785 Hyperlipidemia, unspecified: Secondary | ICD-10-CM | POA: Diagnosis not present

## 2016-03-19 LAB — CBC WITH DIFFERENTIAL/PLATELET
BASOS PCT: 0.5 % (ref 0.0–3.0)
Basophils Absolute: 0 10*3/uL (ref 0.0–0.1)
EOS PCT: 1.7 % (ref 0.0–5.0)
Eosinophils Absolute: 0.1 10*3/uL (ref 0.0–0.7)
HEMATOCRIT: 41.7 % (ref 36.0–46.0)
HEMOGLOBIN: 13.8 g/dL (ref 12.0–15.0)
LYMPHS PCT: 25.1 % (ref 12.0–46.0)
Lymphs Abs: 1.8 10*3/uL (ref 0.7–4.0)
MCHC: 33.2 g/dL (ref 30.0–36.0)
MCV: 89.5 fl (ref 78.0–100.0)
MONO ABS: 0.6 10*3/uL (ref 0.1–1.0)
Monocytes Relative: 8.4 % (ref 3.0–12.0)
Neutro Abs: 4.7 10*3/uL (ref 1.4–7.7)
Neutrophils Relative %: 64.3 % (ref 43.0–77.0)
PLATELETS: 222 10*3/uL (ref 150.0–400.0)
RBC: 4.66 Mil/uL (ref 3.87–5.11)
RDW: 13.6 % (ref 11.5–15.5)
WBC: 7.3 10*3/uL (ref 4.0–10.5)

## 2016-03-19 LAB — COMPREHENSIVE METABOLIC PANEL
ALBUMIN: 4.2 g/dL (ref 3.5–5.2)
ALT: 14 U/L (ref 0–35)
AST: 17 U/L (ref 0–37)
Alkaline Phosphatase: 47 U/L (ref 39–117)
BUN: 16 mg/dL (ref 6–23)
CALCIUM: 9.9 mg/dL (ref 8.4–10.5)
CHLORIDE: 103 meq/L (ref 96–112)
CO2: 32 meq/L (ref 19–32)
CREATININE: 1.06 mg/dL (ref 0.40–1.20)
GFR: 54.21 mL/min — AB (ref >60.00)
Glucose, Bld: 101 mg/dL — ABNORMAL HIGH (ref 70–99)
POTASSIUM: 4.6 meq/L (ref 3.5–5.1)
Sodium: 141 mEq/L (ref 135–145)
Total Bilirubin: 0.6 mg/dL (ref 0.2–1.2)
Total Protein: 6.8 g/dL (ref 6.0–8.3)

## 2016-03-19 LAB — LIPID PANEL
CHOL/HDL RATIO: 5
CHOLESTEROL: 168 mg/dL (ref 0–200)
HDL: 36.4 mg/dL — AB (ref >39.00)
LDL CALC: 106 mg/dL — AB (ref 0–99)
NonHDL: 131.32
TRIGLYCERIDES: 128 mg/dL (ref 0.0–149.0)
VLDL: 25.6 mg/dL (ref 0.0–40.0)

## 2016-03-19 LAB — TSH: TSH: 3.65 u[IU]/mL (ref 0.35–4.50)

## 2016-03-19 MED ORDER — METHYLPHENIDATE HCL 10 MG PO TABS: 10.0000 mg | ORAL_TABLET | Freq: Three times a day (TID) | ORAL | Status: DC | PRN

## 2016-03-19 NOTE — Assessment & Plan Note (Signed)
Well controlled. No changes made today. 

## 2016-03-19 NOTE — Patient Instructions (Signed)
Great to see you.  We will call you with your lab results. 

## 2016-03-19 NOTE — Assessment & Plan Note (Signed)
Well controlled on current rx.

## 2016-03-19 NOTE — Telephone Encounter (Signed)
Given to pt at 6/14 appt

## 2016-03-19 NOTE — Progress Notes (Signed)
Pre visit review using our clinic review tool, if applicable. No additional management support is needed unless otherwise documented below in the visit note. 

## 2016-03-19 NOTE — Assessment & Plan Note (Signed)
Continue current dose of zocor. Due for labs today. Orders Placed This Encounter  Procedures  . Lipid panel  . Comprehensive metabolic panel  . TSH  . CBC with Differential/Platelet

## 2016-03-19 NOTE — Progress Notes (Signed)
Very pleasant 70 yo female here for follow up.  Mood disorder- On Effexor and lamictal.  Does have a therapist from [place] who she keeps in contact with.  No SI or HI. Things have been better with her daughter.  Wt Readings from Last 3 Encounters:  03/19/16 178 lb 12 oz (81.08 kg)  01/16/16 177 lb (80.287 kg)  08/27/15 179 lb (81.194 kg)   ADHD- symptoms were controlled on current dose of Ritalin. She would like to try short acting rx again.  HLD- on zocor 40 mg daily. Due for labs. Lab Results  Component Value Date   CHOL 172 12/20/2014   HDL 36.20* 12/20/2014   LDLCALC 113* 12/20/2014   LDLDIRECT 93.7 01/14/2011   TRIG 115.0 12/20/2014   CHOLHDL 5 12/20/2014    Patient Active Problem List   Diagnosis Date Noted  . Right hand pain 08/27/2015  . Fatigue 06/14/2015  . Weight gain 01/16/2014  . Orthostatic hypotension 10/05/2013  . Lightheadedness 07/04/2013  . Anxiety state 06/23/2013  . Partial tear of rotator cuff 05/27/2013  . Left shoulder pain 05/23/2013  . ADHD (attention deficit hyperactivity disorder) 01/14/2011  . TIA 05/22/2010  . OSTEOPENIA 01/21/2010  . HLD (hyperlipidemia) 11/28/2009  . MOOD DISORDER 11/28/2009  . GERD 11/28/2009  . COLONIC POLYPS, HX OF 11/28/2009   Past Medical History  Diagnosis Date  . Mood disorder (HCC)     NOS  . Fibromyalgia   . GERD (gastroesophageal reflux disease)   . Hyperlipidemia   . IBS (irritable bowel syndrome)   . History of colonic polyps   . History of stroke     ministroke x3  . Imbalance 11/2012    s/[facility] PT at [facility]  . Arthritis   . Osteoporosis   . CRVO (central retinal vein occlusion)     Left  . Stroke [facility])     TIA  . Cataract     right  . Depression   . Thyroid disease    Past Surgical History  Procedure Laterality Date  . Thyroidectomy  2007    partial   . Cosmetic surgery    . Cholecystectomy  1995  . Thumb arthroscopy      left  . Rectal ultrasound N/A 05/16/2013    Procedure:  RECTAL ULTRASOUND;  Surgeon: Leighton Ruff, MD;  Location: [facility];  Service: Endoscopy;  Laterality: N/A;  REQUEST FEMALE NURSE AND TECH  . Anal rectal manometry N/A 05/16/2013    Procedure: ANAL RECTAL MANOMETRY;  Surgeon: Leighton Ruff, MD;  Location: [facility];  Service: Endoscopy;  Laterality: N/A;  . Cataract extraction, bilateral Bilateral 07/20/2014  . Foot fracture surgery  2016    5th metarsal and cuboid    Social History  Substance Use Topics  . Smoking status: Former Smoker    Types: Cigarettes    Quit date: 10/06/1974  . Smokeless tobacco: Never Used  . Alcohol Use: 0.0 oz/week    0 Standard drinks or equivalent per week     Comment: avg 2 drinks per month   Family History  Problem Relation Age of Onset  . Stroke Mother   . Stroke Father   . Heart disease Brother   . Cancer Brother   . Colon cancer Neg Hx   . Esophageal cancer Neg Hx   . Rectal cancer Neg Hx   . Stomach cancer Neg Hx    Allergies  Allergen Reactions  . Latex     Causes redness and  raised areas on skin   Current Outpatient Prescriptions on File Prior to Visit  Medication Sig Dispense Refill  . alendronate (FOSAMAX) 70 MG tablet TAKE ONE TABLET BY MOUTH EVERY 7 DAYS. TAKE WITH FULL GLASS OF WATER ON EMPTY STOMACH 12 tablet 3  . calcium carbonate 200 MG capsule Take 250 mg by mouth 2 (two) times daily with a meal.    . Cholecalciferol (VITAMIN D3) 2000 UNITS capsule Take 2,000 Units by mouth daily.     . Cyanocobalamin (VITAMIN B 12 PO) Take by mouth daily.    Marland Kitchen escitalopram (LEXAPRO) 20 MG tablet Take 1 tablet (20 mg total) by mouth daily. 30 tablet 6  . Krill Oil 1000 MG CAPS Take 1,000 mg by mouth daily.    Marland Kitchen lamoTRIgine (LAMICTAL) 200 MG tablet TAKE ONE TABLET BY MOUTH ONCE DAILY 30 tablet 2  . methylphenidate (RITALIN) 10 MG tablet Take 1 tablet (10 mg total) by mouth 3 (three) times daily as needed. 90 tablet 0  . niacin (NIASPAN) 1000 MG CR tablet Take 1 tablet (1,000 mg total) by  mouth at bedtime. 90 tablet 3  . omeprazole (PRILOSEC) 40 MG capsule TAKE ONE CAPSULE BY MOUTH TWICE DAILY 180 capsule 3  . simvastatin (ZOCOR) 40 MG tablet TAKE ONE TABLET BY MOUTH ONCE DAILY 30 tablet 2  . traZODone (DESYREL) 100 MG tablet TAKE TWO TABLETS BY MOUTH AT BEDTIME 180 tablet 2   No current facility-administered medications on file prior to visit.     The PMH, PSH, Social History, Family History, Medications, and allergies have been reviewed in [facility], and have been updated if relevant.   Review of Systems    Review of Systems  Constitutional: Negative.   Psychiatric/Behavioral: Negative for suicidal ideas, sleep disturbance, self-injury, dysphoric mood and decreased concentration. The patient is not nervous/anxious.   All other systems reviewed and are negative.     Physical Exam BP 114/62 mmHg  Pulse 64  Temp(Src) 97.8 F (36.6 C) (Oral)  Wt 178 lb 12 oz (81.08 kg)  SpO2 97%   General:  Well-developed,well-nourished,in no acute distress; alert,appropriate and cooperative throughout examination Head:  normocephalic and atraumatic.   Eyes:  vision grossly intact, pupils equal, pupils round, and pupils reactive to light.   Ears:  R ear normal and L ear normal.   Lungs:  Normal respiratory effort, chest expands symmetrically. Lungs are clear to auscultation, no crackles or wheezes. Heart:  Normal rate and regular rhythm. S1 and S2 normal without gallop, murmur, click, rub or other extra sounds. Abdomen:  Bowel sounds positive,abdomen soft and non-tender without masses, organomegaly or hernias noted. Msk:  No deformity or scoliosis noted of thoracic or lumbar spine.   Extremities:  No clubbing, cyanosis, edema, or deformity noted with normal full range of motion of all joints.   Neurologic:  alert & oriented X3 and gait normal.   Skin:  Intact without suspicious lesions or rashes Psych: good eye contact.  Not anxious or depressed appearing

## 2016-05-06 ENCOUNTER — Other Ambulatory Visit: Payer: Self-pay

## 2016-05-06 DIAGNOSIS — F909 Attention-deficit hyperactivity disorder, unspecified type: Secondary | ICD-10-CM

## 2016-05-06 NOTE — Telephone Encounter (Signed)
Pt left v/m requesting rx methylphenidate. Call when ready for pick up. Pt last seen and last printed # 90 on 03/19/16.

## 2016-05-06 NOTE — Telephone Encounter (Signed)
Ok to print out and put on my desk for signature. 

## 2016-05-07 MED ORDER — METHYLPHENIDATE HCL 10 MG PO TABS: 10.0000 mg | ORAL_TABLET | Freq: Three times a day (TID) | ORAL | 0 refills | Status: DC | PRN

## 2016-05-07 NOTE — Telephone Encounter (Signed)
Lm on pts vm and informed her Rx is available for pickup from the front desk 

## 2016-05-12 ENCOUNTER — Encounter: Payer: Self-pay | Admitting: Family Medicine

## 2016-05-12 ENCOUNTER — Ambulatory Visit (INDEPENDENT_AMBULATORY_CARE_PROVIDER_SITE_OTHER)
Admission: RE | Admit: 2016-05-12 | Discharge: 2016-05-12 | Disposition: A | Discharge status: Home / Self Care | Payer: Medicare Other | Source: Ambulatory Visit | Attending: Family Medicine | Admitting: Family Medicine

## 2016-05-12 ENCOUNTER — Ambulatory Visit (INDEPENDENT_AMBULATORY_CARE_PROVIDER_SITE_OTHER): Payer: Medicare Other | Admitting: Family Medicine

## 2016-05-12 DIAGNOSIS — M79642 Pain in left hand: Secondary | ICD-10-CM

## 2016-05-12 DIAGNOSIS — M19042 Primary osteoarthritis, left hand: Secondary | ICD-10-CM | POA: Diagnosis not present

## 2016-05-12 DIAGNOSIS — Z79899 Other long term (current) drug therapy: Secondary | ICD-10-CM | POA: Diagnosis not present

## 2016-05-12 NOTE — Progress Notes (Signed)
Subjective:   Patient ID: Nicole Perkins, female    DOB: 09/26/1946, 70 y.o.   MRN: [id number]  Sharen Alleman is a pleasant 70 y.o. year old female who presents to clinic today with Hand Pain (left)  on 05/12/2016  HPI:  Left thumb/hand pain- Intermittent for a few weeks.  Has not really taken anything for it.  Does have a h/o OA, and h/o left hand surgery.  No fevers or chills.  No decreased grip strength.  Current Outpatient Prescriptions on File Prior to Visit  Medication Sig Dispense Refill  . alendronate (FOSAMAX) 70 MG tablet TAKE ONE TABLET BY MOUTH EVERY 7 DAYS. TAKE WITH FULL GLASS OF WATER ON EMPTY STOMACH 12 tablet 3  . calcium carbonate 200 MG capsule Take 250 mg by mouth 2 (two) times daily with a meal.    . Cholecalciferol (VITAMIN D3) 2000 UNITS capsule Take 2,000 Units by mouth daily.     . Cyanocobalamin (VITAMIN B 12 PO) Take by mouth daily.    Marland Kitchen escitalopram (LEXAPRO) 20 MG tablet Take 1 tablet (20 mg total) by mouth daily. 30 tablet 6  . Krill Oil 1000 MG CAPS Take 1,000 mg by mouth daily.    Marland Kitchen lamoTRIgine (LAMICTAL) 200 MG tablet TAKE ONE TABLET BY MOUTH ONCE DAILY 30 tablet 2  . methylphenidate (RITALIN) 10 MG tablet Take 1 tablet (10 mg total) by mouth 3 (three) times daily as needed. 90 tablet 0  . niacin (NIASPAN) 1000 MG CR tablet Take 1 tablet (1,000 mg total) by mouth at bedtime. 90 tablet 3  . omeprazole (PRILOSEC) 40 MG capsule TAKE ONE CAPSULE BY MOUTH TWICE DAILY 180 capsule 3  . simvastatin (ZOCOR) 40 MG tablet TAKE ONE TABLET BY MOUTH ONCE DAILY 90 tablet 3  . traZODone (DESYREL) 100 MG tablet TAKE TWO TABLETS BY MOUTH AT BEDTIME 180 tablet 2   No current facility-administered medications on file prior to visit.     Allergies  Allergen Reactions  . Latex     Causes redness and raised areas on skin    Past Medical History:  Diagnosis Date  . Arthritis   . Cataract    right  . CRVO (central retinal vein occlusion)    Left  .  Depression   . Fibromyalgia   . GERD (gastroesophageal reflux disease)   . History of colonic polyps   . History of stroke    ministroke x3  . Hyperlipidemia   . IBS (irritable bowel syndrome)   . Imbalance 11/2012   s/p PT at [facility]  . Mood disorder (HCC)    NOS  . Osteoporosis   . Stroke [facility])    TIA  . Thyroid disease     Past Surgical History:  Procedure Laterality Date  . ANAL RECTAL MANOMETRY N/A 05/16/2013   Procedure: ANAL RECTAL MANOMETRY;  Surgeon: Leighton Ruff, MD;  Location: [facility];  Service: Endoscopy;  Laterality: N/A;  . CATARACT EXTRACTION, BILATERAL Bilateral 07/20/2014  . CHOLECYSTECTOMY  1995  . COSMETIC SURGERY    . FOOT FRACTURE SURGERY  2016   5th metarsal and cuboid   . RECTAL ULTRASOUND N/A 05/16/2013   Procedure: RECTAL ULTRASOUND;  Surgeon: Leighton Ruff, MD;  Location: [facility];  Service: Endoscopy;  Laterality: N/A;  REQUEST FEMALE NURSE AND TECH  . THUMB ARTHROSCOPY     left  . THYROIDECTOMY  2007   partial     Family History  Problem Relation Age of Onset  .  Stroke Mother   . Stroke Father   . Heart disease Brother   . Cancer Brother   . Colon cancer Neg Hx   . Esophageal cancer Neg Hx   . Rectal cancer Neg Hx   . Stomach cancer Neg Hx     Social History   Social History  . Marital status: Single    Spouse name: N/A  . Number of children: 5  . Years of education: Masters +   Occupational History  . Retired Education officer, museum Retired   Social History Main Topics  . Smoking status: Former Smoker    Types: Cigarettes    Quit date: 10/06/1974  . Smokeless tobacco: Never Used  . Alcohol use 0.0 oz/week     Comment: avg 2 drinks per month  . Drug use: No  . Sexual activity: No   Other Topics Concern  . Not on file   Social History Narrative  . No narrative on file   The PMH, PSH, Social History, Family History, Medications, and allergies have been reviewed in [facility], and have been updated if relevant.   Review of  Systems  Constitutional: Negative.   Musculoskeletal: Positive for arthralgias and joint swelling. Negative for back pain, gait problem, myalgias, neck pain and neck stiffness.  All other systems reviewed and are negative.      Objective:    BP 116/64   Pulse 71   Temp 97.9 F (36.6 C) (Oral)   Wt 179 lb 8 oz (81.4 kg)   SpO2 95%   BMI 30.81 kg/m    Physical Exam  Constitutional: She is oriented to person, place, and time. She appears well-developed and well-nourished. No distress.  HENT:  Head: Normocephalic.  Eyes: Conjunctivae are normal.  Cardiovascular: Normal rate.   Pulmonary/Chest: Effort normal.  Musculoskeletal:       Left hand: She exhibits bony tenderness. She exhibits normal range of motion and no deformity.  Neurological: She is alert and oriented to person, place, and time. No cranial nerve deficit.  Skin: Skin is dry. She is not diaphoretic.  Psychiatric: She has a normal mood and affect. Her behavior is normal. Judgment and thought content normal.  Nursing note and vitals reviewed.         Assessment & Plan:   Left hand pain - Plan: DG Hand Complete Left No Follow-up on file.

## 2016-05-12 NOTE — Patient Instructions (Signed)
Great to see you.  I will call you with your xray results. 

## 2016-05-12 NOTE — Assessment & Plan Note (Signed)
New- probable OA. Will get xray given previous h/o hand surgery. The patient indicates understanding of these issues and agrees with the plan. Orders Placed This Encounter  Procedures  . DG Hand Complete Left

## 2016-05-12 NOTE — Progress Notes (Signed)
Pre visit review using our clinic review tool, if applicable. No additional management support is needed unless otherwise documented below in the visit note. 

## 2016-05-13 ENCOUNTER — Other Ambulatory Visit: Payer: Self-pay | Admitting: Family Medicine

## 2016-05-13 DIAGNOSIS — M79642 Pain in left hand: Secondary | ICD-10-CM

## 2016-05-31 ENCOUNTER — Encounter: Payer: Self-pay | Admitting: Family Medicine

## 2016-06-02 ENCOUNTER — Encounter: Payer: Self-pay | Admitting: Family Medicine

## 2016-06-06 ENCOUNTER — Other Ambulatory Visit: Payer: Self-pay | Admitting: Family Medicine

## 2016-06-06 ENCOUNTER — Encounter: Payer: Self-pay | Admitting: Family Medicine

## 2016-06-10 ENCOUNTER — Encounter: Payer: Self-pay | Admitting: Family Medicine

## 2016-06-12 ENCOUNTER — Encounter: Payer: Self-pay | Admitting: Family Medicine

## 2016-06-16 ENCOUNTER — Ambulatory Visit: Payer: Medicare Other | Admitting: Family Medicine

## 2016-06-17 DIAGNOSIS — M19041 Primary osteoarthritis, right hand: Secondary | ICD-10-CM | POA: Diagnosis not present

## 2016-06-17 DIAGNOSIS — M19042 Primary osteoarthritis, left hand: Secondary | ICD-10-CM | POA: Diagnosis not present

## 2016-07-10 IMAGING — CT CT ABD-PELV W/ CM
2 of 5 series · 17 of 46 positions shown, 19 images · IV contrast (Omnipaque 300)
Comparison: None.

CLINICAL DATA: Left lower quadrant abdomen pain since [REDACTED].

EXAM:
CT ABDOMEN AND PELVIS WITH CONTRAST
TECHNIQUE: Multidetector CT imaging of the abdomen and pelvis was performed
using the standard protocol following bolus administration of
intravenous contrast.
CONTRAST:  100 mL Omnipaque 300.

[Series 2: abd/ pel 5mm · axial · 0.70mm/px · z∈[-440,-30]mm · 14 of 92 slices shown, 16 images]
[im 5/92  soft-tissue]
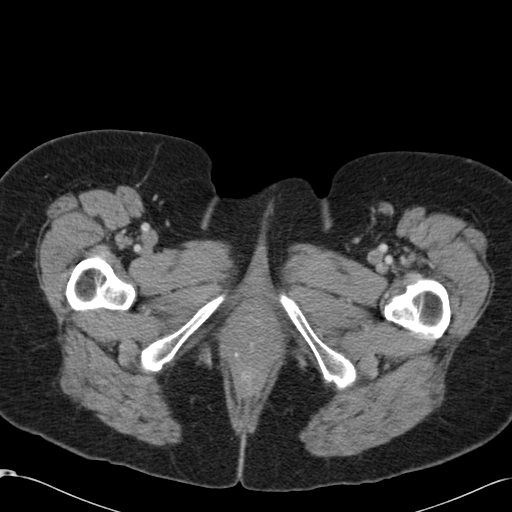
[im 5/92  bone]
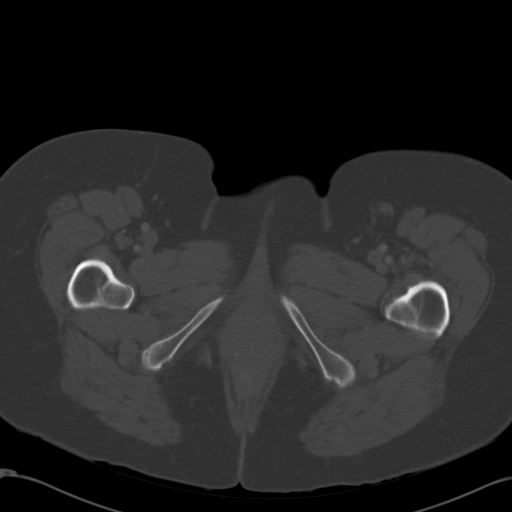
[im 14/92  soft-tissue]
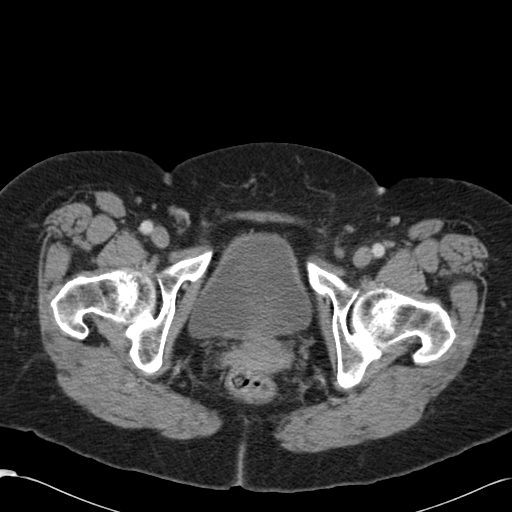
[im 19/92  soft-tissue]
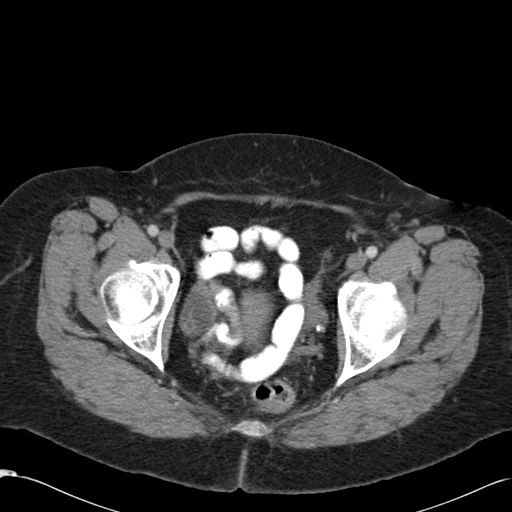
[im 23/92  soft-tissue]
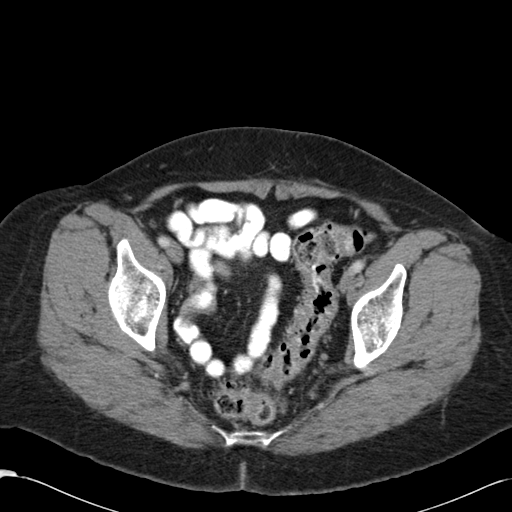
[im 32/92  soft-tissue]
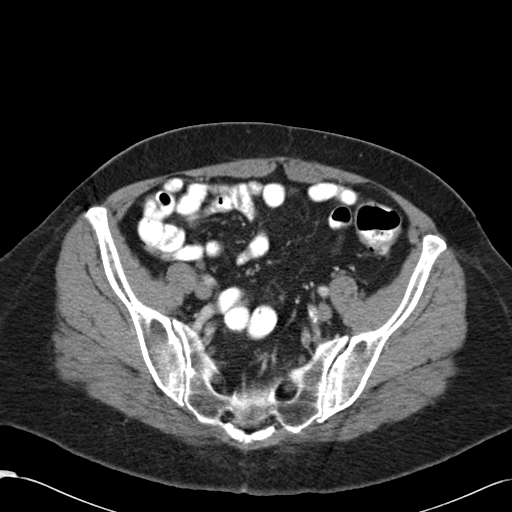
[im 37/92  soft-tissue]
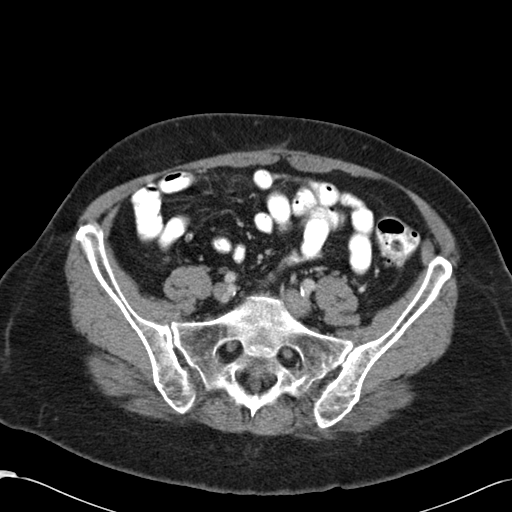
[im 41/92  soft-tissue]
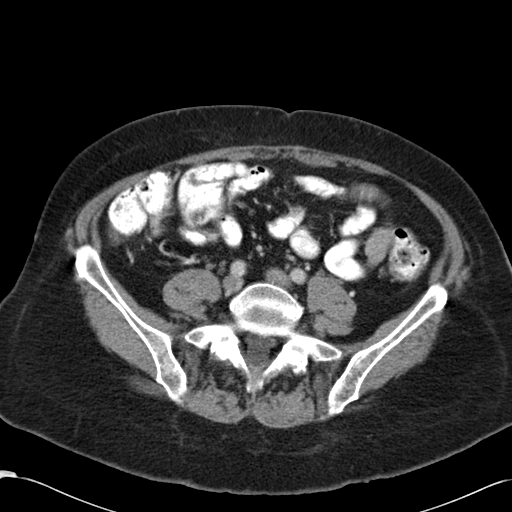
[im 51/92  soft-tissue]
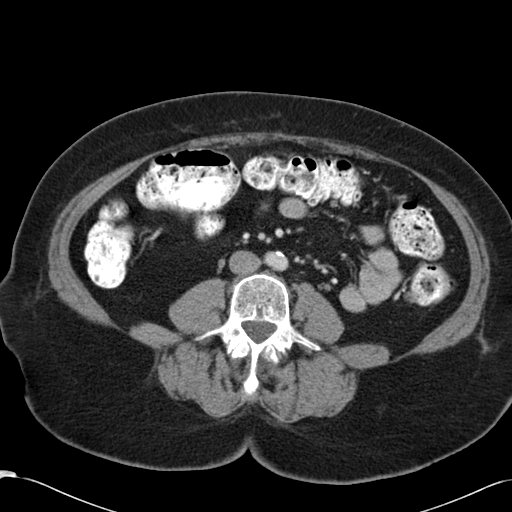
[im 55/92  soft-tissue]
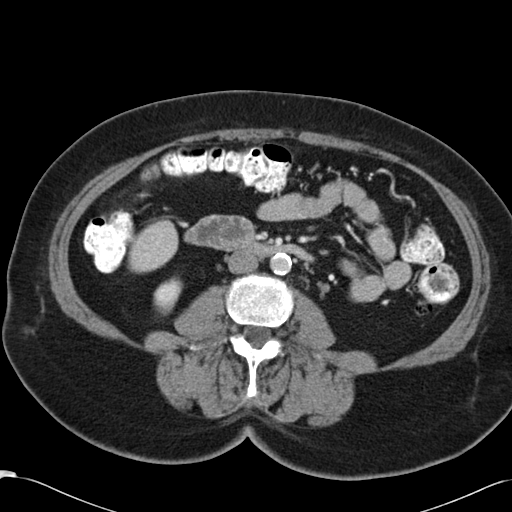
[im 55/92  bone]
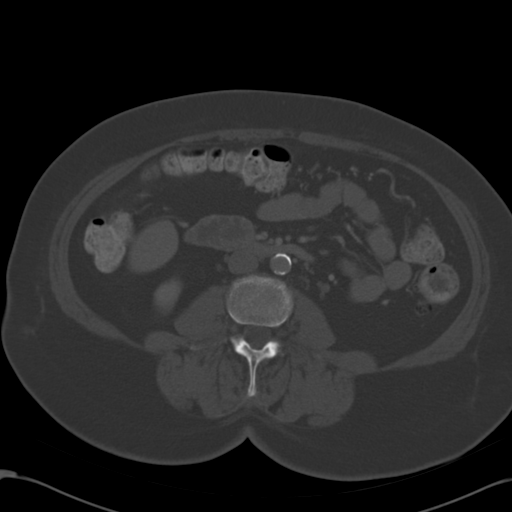
[im 60/92  soft-tissue]
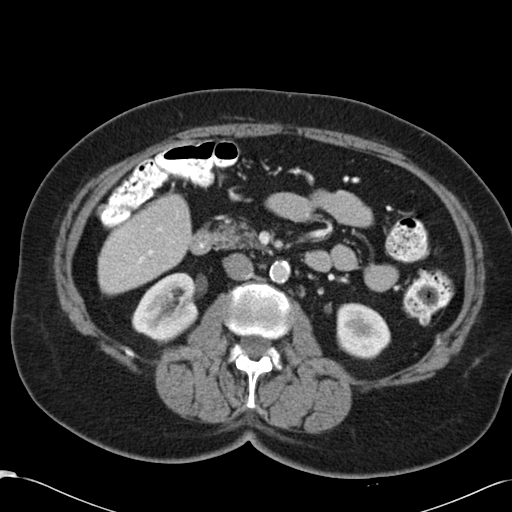
[im 69/92  soft-tissue]
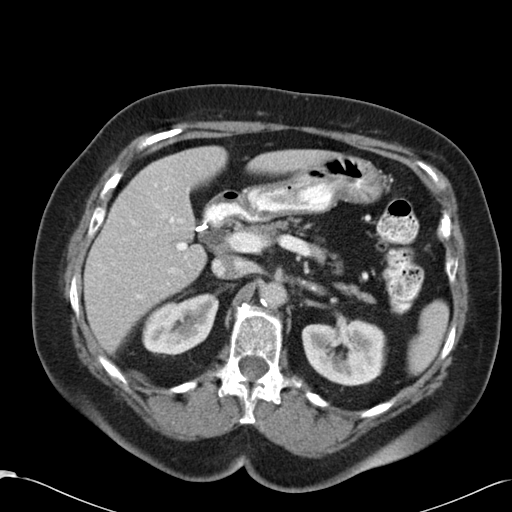
[im 73/92  soft-tissue]
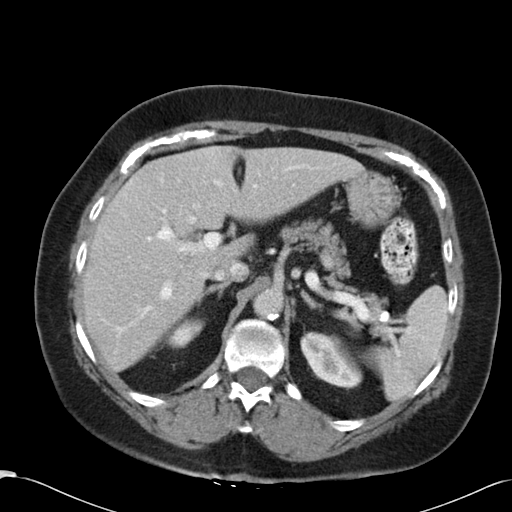
[im 78/92  soft-tissue]
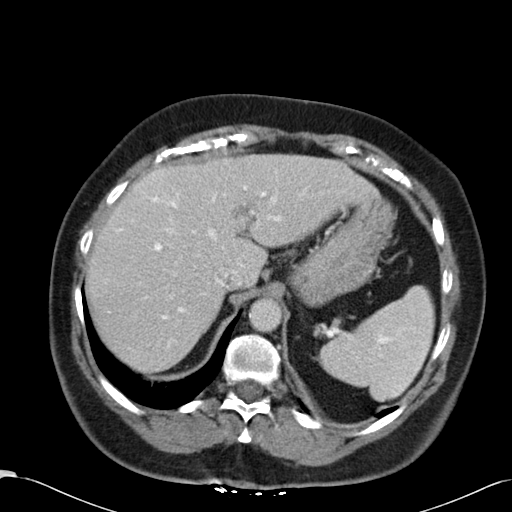
[im 87/92  soft-tissue]
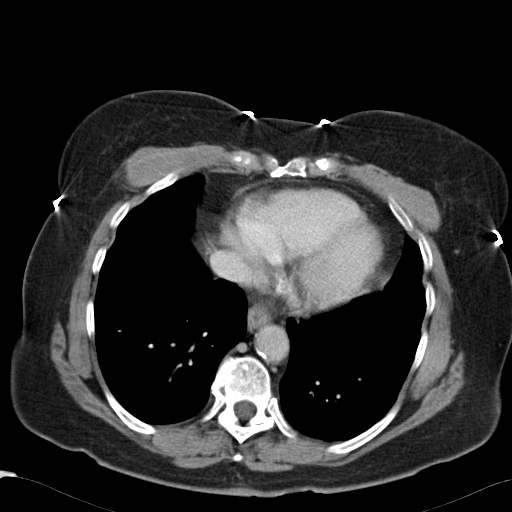

[Series 602: cor · coronal · 0.93mm/px · 3 of 116 slices shown]
[im 39/116  soft-tissue]
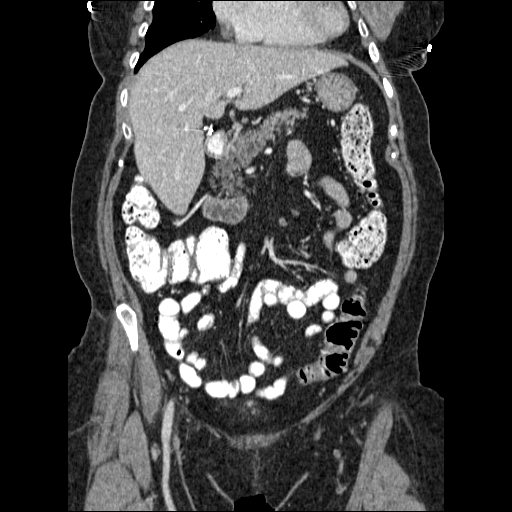
[im 52/116  soft-tissue]
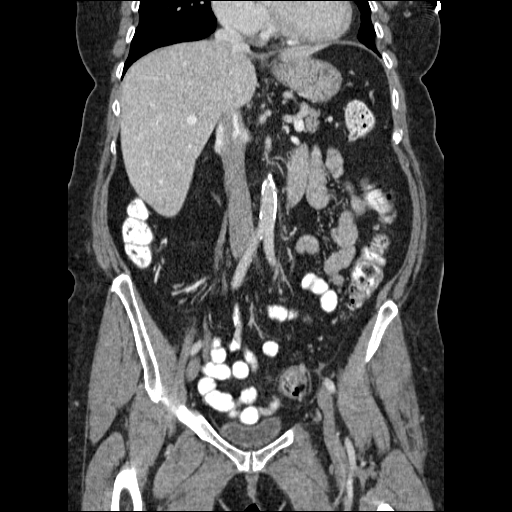
[im 64/116  soft-tissue]
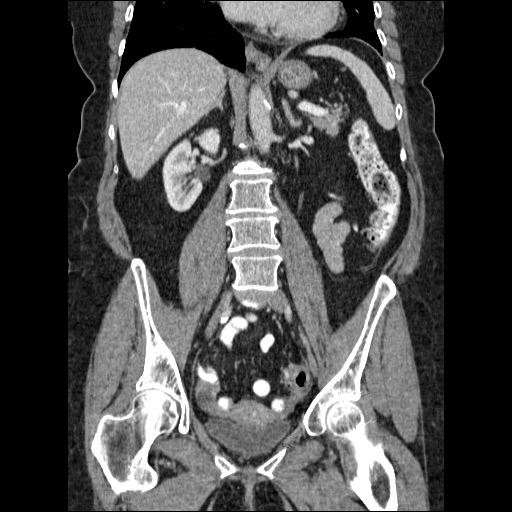

[17 of 46 positions shown; findings below may reference images not displayed]

FINDINGS: The liver is normal. The patient is status post prior
cholecystectomy. The spleen, pancreas, adrenal glands and kidneys
are normal. There is no hydronephrosis bilaterally. There is
atherosclerosis of the abdominal aorta without aneurysmal
dilatation. There is no abdominal lymphadenopathy. Images of the
bowel demonstrate no small bowel obstruction. The appendix is
normal. There is a minimal hiatal hernia. There is diverticulosis of
colon. There is minimal diffuse bowel wall thickening of the rectal
sigmoid colon with mild stranding surrounding the rectum. These
findings are nonspecific but can be seen in colitis. The descending,
transverse, and ascending colon are normal.

Partial fluid-filled bladder is normal. The uterus is normal. There
are cysts within the right ovary, largest measures 2.1 cm. No acute
abnormalities identified within the visualized lung bases. There is
minimal dependent atelectasis of the posterior left lung base. Mild
degenerative joint changes of the spine are identified.
IMPRESSION: Mild diffuse bowel wall thickening of the rectal sigmoid colon with
mild stranding surrounding rectum. These findings are nonspecific
but can be seen in colitis. The appendix is normal.

Cysts within the right ovary, largest measures 2.1 cm. Given
patient's postmenopausal age, further evaluation with pelvic
ultrasound is recommended for assessment internal architecture.

## 2016-07-10 IMAGING — CR DG ABDOMEN 1V
1 series · 1 of 1 positions shown · non-contrast
Comparison: None.

CLINICAL DATA: Pain

EXAM:
ABDOMEN - 1 VIEW

[view not recorded]
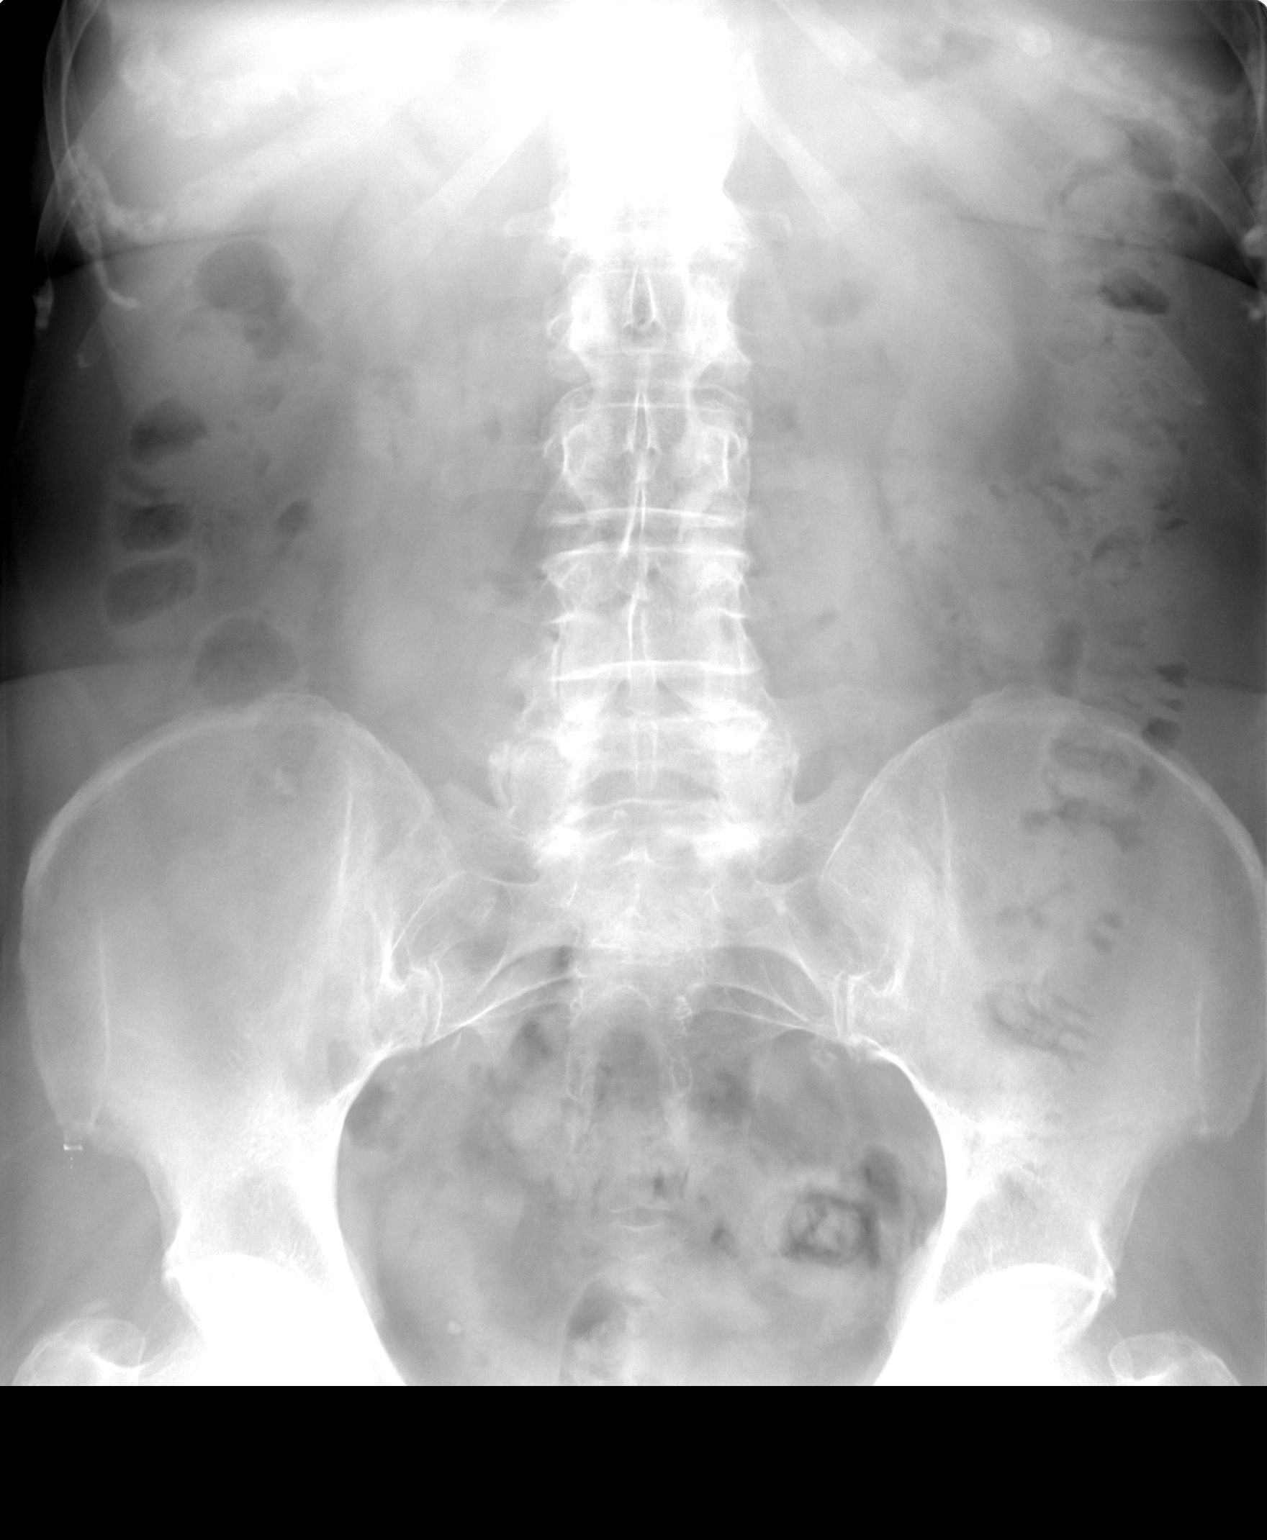

[1 of 1 positions shown; findings below may reference images not displayed]

FINDINGS: There is fairly diffuse stool throughout colon. The overall bowel
gas pattern is unremarkable. No obstruction or free air is seen on
this supine examination. There are surgical clips in the gallbladder
fossa region. There is a probable phlebolith in the right pelvis.
IMPRESSION: Fairly diffuse stool throughout colon. Overall bowel gas pattern
unremarkable.

## 2016-07-28 ENCOUNTER — Other Ambulatory Visit: Payer: Self-pay

## 2016-07-28 DIAGNOSIS — F909 Attention-deficit hyperactivity disorder, unspecified type: Secondary | ICD-10-CM

## 2016-07-28 MED ORDER — METHYLPHENIDATE HCL 10 MG PO TABS: 10.0000 mg | ORAL_TABLET | Freq: Three times a day (TID) | ORAL | 0 refills | Status: DC | PRN

## 2016-07-28 NOTE — Telephone Encounter (Signed)
Last refill 05/07/16 #90  Last OV 03/19/16. Ok to refill?

## 2016-07-29 DIAGNOSIS — M65341 Trigger finger, right ring finger: Secondary | ICD-10-CM | POA: Diagnosis not present

## 2016-07-29 NOTE — Telephone Encounter (Signed)
Spoke to pt and informed her Rx is available for pickup from the front desk 

## 2016-07-31 DIAGNOSIS — Z23 Encounter for immunization: Secondary | ICD-10-CM | POA: Diagnosis not present

## 2016-08-04 DIAGNOSIS — Z79899 Other long term (current) drug therapy: Secondary | ICD-10-CM | POA: Diagnosis not present

## 2016-08-04 DIAGNOSIS — F3130 Bipolar disorder, current episode depressed, mild or moderate severity, unspecified: Secondary | ICD-10-CM | POA: Diagnosis not present

## 2016-08-04 DIAGNOSIS — Z87891 Personal history of nicotine dependence: Secondary | ICD-10-CM | POA: Diagnosis not present

## 2016-08-04 DIAGNOSIS — Z8673 Personal history of transient ischemic attack (TIA), and cerebral infarction without residual deficits: Secondary | ICD-10-CM | POA: Diagnosis not present

## 2016-08-04 DIAGNOSIS — M65342 Trigger finger, left ring finger: Secondary | ICD-10-CM | POA: Diagnosis not present

## 2016-08-04 DIAGNOSIS — K219 Gastro-esophageal reflux disease without esophagitis: Secondary | ICD-10-CM | POA: Diagnosis not present

## 2016-08-04 DIAGNOSIS — M1990 Unspecified osteoarthritis, unspecified site: Secondary | ICD-10-CM | POA: Diagnosis not present

## 2016-08-04 DIAGNOSIS — Z7983 Long term (current) use of bisphosphonates: Secondary | ICD-10-CM | POA: Diagnosis not present

## 2016-08-04 DIAGNOSIS — M65332 Trigger finger, left middle finger: Secondary | ICD-10-CM | POA: Diagnosis not present

## 2016-08-04 DIAGNOSIS — E785 Hyperlipidemia, unspecified: Secondary | ICD-10-CM | POA: Diagnosis not present

## 2016-08-26 ENCOUNTER — Other Ambulatory Visit: Payer: Self-pay | Admitting: Family Medicine

## 2016-09-03 DIAGNOSIS — Z961 Presence of intraocular lens: Secondary | ICD-10-CM | POA: Diagnosis not present

## 2016-09-03 DIAGNOSIS — H40013 Open angle with borderline findings, low risk, bilateral: Secondary | ICD-10-CM | POA: Diagnosis not present

## 2016-09-03 DIAGNOSIS — H5213 Myopia, bilateral: Secondary | ICD-10-CM | POA: Diagnosis not present

## 2016-09-03 DIAGNOSIS — M65341 Trigger finger, right ring finger: Secondary | ICD-10-CM | POA: Diagnosis not present

## 2016-09-04 ENCOUNTER — Other Ambulatory Visit: Payer: Self-pay

## 2016-09-04 DIAGNOSIS — F909 Attention-deficit hyperactivity disorder, unspecified type: Secondary | ICD-10-CM

## 2016-09-04 MED ORDER — METHYLPHENIDATE HCL 10 MG PO TABS: 10.0000 mg | ORAL_TABLET | Freq: Three times a day (TID) | ORAL | 0 refills | Status: DC | PRN

## 2016-09-04 NOTE — Telephone Encounter (Signed)
Pt left v/m requesting rx methylphenidate. Call when ready for pick up. Pt would like to pick up on 09/05/16. Last printed # 90 on 07/28/16; last f/u appt 03/19/16.

## 2016-09-04 NOTE — Telephone Encounter (Signed)
RX printed and signed and given to Waynetta 

## 2016-09-04 NOTE — Telephone Encounter (Signed)
Spoke to pt and informed her Rx is available for pickup from the front desk 

## 2016-09-09 DIAGNOSIS — M65341 Trigger finger, right ring finger: Secondary | ICD-10-CM | POA: Diagnosis not present

## 2016-09-15 DIAGNOSIS — M65341 Trigger finger, right ring finger: Secondary | ICD-10-CM | POA: Diagnosis not present

## 2016-09-19 ENCOUNTER — Other Ambulatory Visit: Payer: Self-pay | Admitting: Family Medicine

## 2016-10-01 DIAGNOSIS — H40013 Open angle with borderline findings, low risk, bilateral: Secondary | ICD-10-CM | POA: Diagnosis not present

## 2016-10-01 DIAGNOSIS — H04123 Dry eye syndrome of bilateral lacrimal glands: Secondary | ICD-10-CM | POA: Diagnosis not present

## 2016-10-01 DIAGNOSIS — H349 Unspecified retinal vascular occlusion: Secondary | ICD-10-CM | POA: Diagnosis not present

## 2016-10-13 ENCOUNTER — Other Ambulatory Visit: Payer: Self-pay

## 2016-10-13 DIAGNOSIS — F909 Attention-deficit hyperactivity disorder, unspecified type: Secondary | ICD-10-CM

## 2016-10-13 NOTE — Telephone Encounter (Signed)
Ok to print and put on my desk for signature. 

## 2016-10-13 NOTE — Telephone Encounter (Signed)
Pt left v/m requesting rx methylphenidate. Call when ready for pick up. Last seen 03/19/16 for ADHD and last printed # 90 on 09/04/16.

## 2016-10-14 MED ORDER — METHYLPHENIDATE HCL 10 MG PO TABS: 10.0000 mg | ORAL_TABLET | Freq: Three times a day (TID) | ORAL | 0 refills | Status: DC | PRN

## 2016-11-12 DIAGNOSIS — H16223 Keratoconjunctivitis sicca, not specified as Sjogren's, bilateral: Secondary | ICD-10-CM | POA: Diagnosis not present

## 2016-11-19 ENCOUNTER — Other Ambulatory Visit: Payer: Self-pay | Admitting: Family Medicine

## 2016-11-20 ENCOUNTER — Encounter: Payer: Self-pay | Admitting: Family Medicine

## 2016-11-21 ENCOUNTER — Other Ambulatory Visit: Payer: Self-pay

## 2016-11-21 DIAGNOSIS — F909 Attention-deficit hyperactivity disorder, unspecified type: Secondary | ICD-10-CM

## 2016-11-21 MED ORDER — METHYLPHENIDATE HCL 10 MG PO TABS: 10.0000 mg | ORAL_TABLET | Freq: Three times a day (TID) | ORAL | 0 refills | Status: DC | PRN

## 2016-11-21 NOTE — Telephone Encounter (Signed)
Printed and in kims'box  

## 2016-11-21 NOTE — Telephone Encounter (Signed)
Message left advising patient and Rx placed up front for pick up. 

## 2016-11-21 NOTE — Telephone Encounter (Signed)
Pt left v/m requesting rx for methylphenidate. Call when ready for pick up; pt will be in this area on 11/21/16 and request to pick up today. Last printed # 90 on 10/14/16. Last seen 03/09/16. Dr Deborra Medina out of office today.

## 2016-12-19 DIAGNOSIS — M79641 Pain in right hand: Secondary | ICD-10-CM | POA: Diagnosis not present

## 2016-12-19 DIAGNOSIS — M19042 Primary osteoarthritis, left hand: Secondary | ICD-10-CM | POA: Diagnosis not present

## 2016-12-19 DIAGNOSIS — M65332 Trigger finger, left middle finger: Secondary | ICD-10-CM | POA: Diagnosis not present

## 2016-12-19 DIAGNOSIS — M65342 Trigger finger, left ring finger: Secondary | ICD-10-CM | POA: Diagnosis not present

## 2016-12-19 DIAGNOSIS — Z471 Aftercare following joint replacement surgery: Secondary | ICD-10-CM | POA: Diagnosis not present

## 2016-12-19 DIAGNOSIS — M79642 Pain in left hand: Secondary | ICD-10-CM | POA: Diagnosis not present

## 2016-12-19 DIAGNOSIS — M19041 Primary osteoarthritis, right hand: Secondary | ICD-10-CM | POA: Diagnosis not present

## 2016-12-19 DIAGNOSIS — M1811 Unilateral primary osteoarthritis of first carpometacarpal joint, right hand: Secondary | ICD-10-CM | POA: Diagnosis not present

## 2016-12-19 DIAGNOSIS — Z96692 Finger-joint replacement of left hand: Secondary | ICD-10-CM | POA: Diagnosis not present

## 2016-12-31 DIAGNOSIS — Z79899 Other long term (current) drug therapy: Secondary | ICD-10-CM | POA: Diagnosis not present

## 2016-12-31 DIAGNOSIS — M545 Low back pain: Secondary | ICD-10-CM | POA: Diagnosis not present

## 2016-12-31 DIAGNOSIS — M5136 Other intervertebral disc degeneration, lumbar region: Secondary | ICD-10-CM | POA: Diagnosis not present

## 2016-12-31 DIAGNOSIS — T1490XA Injury, unspecified, initial encounter: Secondary | ICD-10-CM | POA: Diagnosis not present

## 2016-12-31 DIAGNOSIS — M546 Pain in thoracic spine: Secondary | ICD-10-CM | POA: Diagnosis not present

## 2016-12-31 DIAGNOSIS — S6391XA Sprain of unspecified part of right wrist and hand, initial encounter: Secondary | ICD-10-CM | POA: Diagnosis not present

## 2016-12-31 DIAGNOSIS — M79641 Pain in right hand: Secondary | ICD-10-CM | POA: Diagnosis not present

## 2017-01-06 ENCOUNTER — Other Ambulatory Visit: Payer: Self-pay | Admitting: Family Medicine

## 2017-01-16 ENCOUNTER — Ambulatory Visit: Payer: Medicare Other

## 2017-01-26 ENCOUNTER — Other Ambulatory Visit: Payer: Self-pay | Admitting: Family Medicine

## 2017-01-26 DIAGNOSIS — Z01419 Encounter for gynecological examination (general) (routine) without abnormal findings: Secondary | ICD-10-CM | POA: Insufficient documentation

## 2017-01-26 DIAGNOSIS — E785 Hyperlipidemia, unspecified: Secondary | ICD-10-CM

## 2017-01-28 ENCOUNTER — Other Ambulatory Visit: Payer: Self-pay

## 2017-01-28 DIAGNOSIS — F909 Attention-deficit hyperactivity disorder, unspecified type: Secondary | ICD-10-CM

## 2017-01-28 MED ORDER — METHYLPHENIDATE HCL 10 MG PO TABS: 10.0000 mg | ORAL_TABLET | Freq: Three times a day (TID) | ORAL | 0 refills | Status: DC | PRN

## 2017-01-28 NOTE — Telephone Encounter (Signed)
Printed.  Thanks.  

## 2017-01-28 NOTE — Telephone Encounter (Signed)
Pt left v/m requesting rx methylphenidate. Call when ready for pick up. Pt is traveling thru our area tomorrow late morning and request to pick up rx then. Last printed #90 on 11/21/16. Last seen 03/19/16 for ADHD. No future appt scheduled.

## 2017-01-28 NOTE — Telephone Encounter (Signed)
Left message on voicemail that script is up front ready for pickup.  

## 2017-01-30 ENCOUNTER — Ambulatory Visit: Payer: Medicare Other

## 2017-03-23 ENCOUNTER — Other Ambulatory Visit: Payer: Self-pay

## 2017-03-23 DIAGNOSIS — F909 Attention-deficit hyperactivity disorder, unspecified type: Secondary | ICD-10-CM

## 2017-03-23 NOTE — Telephone Encounter (Signed)
OK to print and place on my desk for signature.

## 2017-03-23 NOTE — Telephone Encounter (Signed)
Pt left v/m requesting rx methylphenidate. Call when ready for pick up; pt would like to pick up in afternoon on 03/24/17.last printed # 90 on 01/28/17. Last seen 03/19/16.

## 2017-03-24 MED ORDER — METHYLPHENIDATE HCL 10 MG PO TABS
10.0000 mg | ORAL_TABLET | Freq: Three times a day (TID) | ORAL | 0 refills | Status: DC | PRN
Start: 2017-03-24 — End: 2023-11-25

## 2017-03-30 ENCOUNTER — Encounter: Payer: Self-pay | Admitting: Family Medicine

## 2017-03-30 DIAGNOSIS — Z79899 Other long term (current) drug therapy: Secondary | ICD-10-CM | POA: Diagnosis not present

## 2017-04-28 ENCOUNTER — Other Ambulatory Visit: Payer: Self-pay

## 2017-04-28 MED ORDER — SIMVASTATIN 40 MG PO TABS: 40.0000 mg | ORAL_TABLET | Freq: Every day | ORAL | 0 refills | Status: DC

## 2017-04-28 NOTE — Telephone Encounter (Signed)
[facility] [place] called for refill for simvastatin. Refilled simvastatin 40 mg # 90 with note pt needs to call office for appt. Last seen and refilled # 90 x 3 on 03/19/16.

## 2017-05-04 ENCOUNTER — Other Ambulatory Visit: Payer: Self-pay

## 2017-05-25 DIAGNOSIS — F603 Borderline personality disorder: Secondary | ICD-10-CM | POA: Diagnosis not present

## 2017-05-25 DIAGNOSIS — Z1389 Encounter for screening for other disorder: Secondary | ICD-10-CM | POA: Diagnosis not present

## 2017-05-25 DIAGNOSIS — R251 Tremor, unspecified: Secondary | ICD-10-CM | POA: Diagnosis not present

## 2017-05-25 DIAGNOSIS — Z7689 Persons encountering health services in other specified circumstances: Secondary | ICD-10-CM | POA: Diagnosis not present

## 2017-05-25 DIAGNOSIS — F39 Unspecified mood [affective] disorder: Secondary | ICD-10-CM | POA: Diagnosis not present

## 2017-06-22 ENCOUNTER — Ambulatory Visit: Payer: Medicare Other | Admitting: Family Medicine

## 2017-06-22 ENCOUNTER — Ambulatory Visit: Payer: Medicare Other

## 2017-06-25 DIAGNOSIS — F603 Borderline personality disorder: Secondary | ICD-10-CM | POA: Diagnosis not present

## 2017-07-04 ENCOUNTER — Other Ambulatory Visit: Payer: Self-pay | Admitting: Family Medicine

## 2017-07-06 DIAGNOSIS — R42 Dizziness and giddiness: Secondary | ICD-10-CM | POA: Diagnosis not present

## 2017-07-06 DIAGNOSIS — R202 Paresthesia of skin: Secondary | ICD-10-CM | POA: Diagnosis not present

## 2017-07-06 DIAGNOSIS — G250 Essential tremor: Secondary | ICD-10-CM | POA: Diagnosis not present

## 2017-07-07 NOTE — Telephone Encounter (Signed)
Pt request refill lexapro to [facility] [place]; I spoke with Benjamine Mola at [facility] and there are available refills for the lexapro there; Sarah at [facility] will call and get lexapro refills tranferred from [facility]. Pt voiced understanding.

## 2017-07-08 ENCOUNTER — Other Ambulatory Visit: Payer: Self-pay | Admitting: Family Medicine

## 2017-07-21 DIAGNOSIS — F909 Attention-deficit hyperactivity disorder, unspecified type: Secondary | ICD-10-CM | POA: Diagnosis not present

## 2017-07-21 DIAGNOSIS — F329 Major depressive disorder, single episode, unspecified: Secondary | ICD-10-CM | POA: Diagnosis not present

## 2017-07-21 DIAGNOSIS — F4310 Post-traumatic stress disorder, unspecified: Secondary | ICD-10-CM | POA: Diagnosis not present

## 2017-07-28 ENCOUNTER — Other Ambulatory Visit: Payer: Self-pay | Admitting: Family Medicine

## 2017-07-28 DIAGNOSIS — Z23 Encounter for immunization: Secondary | ICD-10-CM | POA: Diagnosis not present

## 2017-07-29 ENCOUNTER — Encounter: Payer: Medicare Other | Admitting: Family Medicine

## 2017-08-08 DIAGNOSIS — R079 Chest pain, unspecified: Secondary | ICD-10-CM | POA: Diagnosis not present

## 2017-08-08 DIAGNOSIS — M549 Dorsalgia, unspecified: Secondary | ICD-10-CM | POA: Diagnosis not present

## 2017-08-08 DIAGNOSIS — R109 Unspecified abdominal pain: Secondary | ICD-10-CM | POA: Diagnosis not present

## 2017-08-08 DIAGNOSIS — T1490XA Injury, unspecified, initial encounter: Secondary | ICD-10-CM | POA: Diagnosis not present

## 2017-08-09 DIAGNOSIS — R079 Chest pain, unspecified: Secondary | ICD-10-CM | POA: Diagnosis not present

## 2017-08-09 DIAGNOSIS — M549 Dorsalgia, unspecified: Secondary | ICD-10-CM | POA: Diagnosis not present

## 2017-08-09 DIAGNOSIS — R109 Unspecified abdominal pain: Secondary | ICD-10-CM | POA: Diagnosis not present

## 2017-08-09 DIAGNOSIS — F99 Mental disorder, not otherwise specified: Secondary | ICD-10-CM | POA: Diagnosis not present

## 2017-08-10 DIAGNOSIS — M549 Dorsalgia, unspecified: Secondary | ICD-10-CM | POA: Diagnosis not present

## 2017-08-10 DIAGNOSIS — F99 Mental disorder, not otherwise specified: Secondary | ICD-10-CM | POA: Diagnosis not present

## 2017-08-11 DIAGNOSIS — M549 Dorsalgia, unspecified: Secondary | ICD-10-CM | POA: Diagnosis present

## 2017-08-11 DIAGNOSIS — Z882 Allergy status to sulfonamides status: Secondary | ICD-10-CM | POA: Diagnosis not present

## 2017-08-11 DIAGNOSIS — F99 Mental disorder, not otherwise specified: Secondary | ICD-10-CM | POA: Diagnosis not present

## 2017-08-11 DIAGNOSIS — K581 Irritable bowel syndrome with constipation: Secondary | ICD-10-CM | POA: Diagnosis present

## 2017-08-11 DIAGNOSIS — M810 Age-related osteoporosis without current pathological fracture: Secondary | ICD-10-CM | POA: Diagnosis not present

## 2017-08-11 DIAGNOSIS — R911 Solitary pulmonary nodule: Secondary | ICD-10-CM | POA: Diagnosis present

## 2017-08-11 DIAGNOSIS — Z79899 Other long term (current) drug therapy: Secondary | ICD-10-CM | POA: Diagnosis not present

## 2017-08-11 DIAGNOSIS — Z87891 Personal history of nicotine dependence: Secondary | ICD-10-CM | POA: Diagnosis not present

## 2017-08-11 DIAGNOSIS — E785 Hyperlipidemia, unspecified: Secondary | ICD-10-CM | POA: Diagnosis present

## 2017-08-11 DIAGNOSIS — K5732 Diverticulitis of large intestine without perforation or abscess without bleeding: Secondary | ICD-10-CM | POA: Diagnosis not present

## 2017-08-11 DIAGNOSIS — K219 Gastro-esophageal reflux disease without esophagitis: Secondary | ICD-10-CM | POA: Diagnosis present

## 2017-08-11 DIAGNOSIS — Z91040 Latex allergy status: Secondary | ICD-10-CM | POA: Diagnosis not present

## 2017-08-17 DIAGNOSIS — M549 Dorsalgia, unspecified: Secondary | ICD-10-CM | POA: Diagnosis not present

## 2017-08-17 DIAGNOSIS — Z09 Encounter for follow-up examination after completed treatment for conditions other than malignant neoplasm: Secondary | ICD-10-CM | POA: Diagnosis not present

## 2017-08-17 DIAGNOSIS — K529 Noninfective gastroenteritis and colitis, unspecified: Secondary | ICD-10-CM | POA: Diagnosis not present

## 2017-08-17 DIAGNOSIS — K5792 Diverticulitis of intestine, part unspecified, without perforation or abscess without bleeding: Secondary | ICD-10-CM | POA: Diagnosis not present

## 2017-08-25 DIAGNOSIS — Z0000 Encounter for general adult medical examination without abnormal findings: Secondary | ICD-10-CM | POA: Diagnosis not present

## 2017-08-25 DIAGNOSIS — K529 Noninfective gastroenteritis and colitis, unspecified: Secondary | ICD-10-CM | POA: Diagnosis not present

## 2017-08-25 DIAGNOSIS — R011 Cardiac murmur, unspecified: Secondary | ICD-10-CM | POA: Diagnosis not present

## 2017-08-25 DIAGNOSIS — Z5181 Encounter for therapeutic drug level monitoring: Secondary | ICD-10-CM | POA: Diagnosis not present

## 2017-08-25 DIAGNOSIS — F909 Attention-deficit hyperactivity disorder, unspecified type: Secondary | ICD-10-CM | POA: Diagnosis not present

## 2017-08-25 DIAGNOSIS — Z1159 Encounter for screening for other viral diseases: Secondary | ICD-10-CM | POA: Diagnosis not present

## 2017-08-25 DIAGNOSIS — G4700 Insomnia, unspecified: Secondary | ICD-10-CM | POA: Diagnosis not present

## 2017-10-05 DIAGNOSIS — M859 Disorder of bone density and structure, unspecified: Secondary | ICD-10-CM | POA: Diagnosis not present

## 2017-10-05 DIAGNOSIS — Z1159 Encounter for screening for other viral diseases: Secondary | ICD-10-CM | POA: Diagnosis not present

## 2017-10-05 DIAGNOSIS — Z0000 Encounter for general adult medical examination without abnormal findings: Secondary | ICD-10-CM | POA: Diagnosis not present

## 2017-10-05 DIAGNOSIS — F5101 Primary insomnia: Secondary | ICD-10-CM | POA: Diagnosis not present

## 2017-10-05 DIAGNOSIS — F900 Attention-deficit hyperactivity disorder, predominantly inattentive type: Secondary | ICD-10-CM | POA: Diagnosis not present

## 2017-10-05 DIAGNOSIS — F330 Major depressive disorder, recurrent, mild: Secondary | ICD-10-CM | POA: Diagnosis not present

## 2017-10-05 DIAGNOSIS — R8290 Unspecified abnormal findings in urine: Secondary | ICD-10-CM | POA: Diagnosis not present

## 2017-10-05 DIAGNOSIS — G459 Transient cerebral ischemic attack, unspecified: Secondary | ICD-10-CM | POA: Diagnosis not present

## 2017-10-05 DIAGNOSIS — M1990 Unspecified osteoarthritis, unspecified site: Secondary | ICD-10-CM | POA: Diagnosis not present

## 2017-10-05 DIAGNOSIS — M8580 Other specified disorders of bone density and structure, unspecified site: Secondary | ICD-10-CM | POA: Diagnosis not present

## 2017-10-05 DIAGNOSIS — K5730 Diverticulosis of large intestine without perforation or abscess without bleeding: Secondary | ICD-10-CM | POA: Diagnosis not present

## 2017-10-05 DIAGNOSIS — E785 Hyperlipidemia, unspecified: Secondary | ICD-10-CM | POA: Diagnosis not present

## 2017-10-28 ENCOUNTER — Other Ambulatory Visit: Payer: Self-pay | Admitting: Family Medicine

## 2017-10-28 DIAGNOSIS — R159 Full incontinence of feces: Secondary | ICD-10-CM | POA: Diagnosis not present

## 2017-10-28 DIAGNOSIS — K5732 Diverticulitis of large intestine without perforation or abscess without bleeding: Secondary | ICD-10-CM | POA: Diagnosis not present

## 2017-10-28 DIAGNOSIS — R197 Diarrhea, unspecified: Secondary | ICD-10-CM | POA: Diagnosis not present

## 2017-12-03 DIAGNOSIS — R197 Diarrhea, unspecified: Secondary | ICD-10-CM | POA: Diagnosis not present

## 2018-01-04 DIAGNOSIS — G4762 Sleep related leg cramps: Secondary | ICD-10-CM | POA: Diagnosis not present

## 2018-01-04 DIAGNOSIS — R918 Other nonspecific abnormal finding of lung field: Secondary | ICD-10-CM | POA: Diagnosis not present

## 2018-01-04 DIAGNOSIS — F329 Major depressive disorder, single episode, unspecified: Secondary | ICD-10-CM | POA: Diagnosis not present

## 2018-01-04 DIAGNOSIS — M5386 Other specified dorsopathies, lumbar region: Secondary | ICD-10-CM | POA: Diagnosis not present

## 2018-01-04 DIAGNOSIS — F909 Attention-deficit hyperactivity disorder, unspecified type: Secondary | ICD-10-CM | POA: Diagnosis not present

## 2018-02-08 DIAGNOSIS — M899 Disorder of bone, unspecified: Secondary | ICD-10-CM | POA: Diagnosis not present

## 2018-02-08 DIAGNOSIS — R002 Palpitations: Secondary | ICD-10-CM | POA: Diagnosis not present

## 2018-02-08 DIAGNOSIS — G250 Essential tremor: Secondary | ICD-10-CM | POA: Diagnosis not present

## 2018-02-08 DIAGNOSIS — F909 Attention-deficit hyperactivity disorder, unspecified type: Secondary | ICD-10-CM | POA: Diagnosis not present

## 2018-02-08 DIAGNOSIS — M5386 Other specified dorsopathies, lumbar region: Secondary | ICD-10-CM | POA: Diagnosis not present

## 2018-02-08 DIAGNOSIS — R918 Other nonspecific abnormal finding of lung field: Secondary | ICD-10-CM | POA: Diagnosis not present

## 2018-02-08 DIAGNOSIS — R140 Abdominal distension (gaseous): Secondary | ICD-10-CM | POA: Diagnosis not present

## 2018-02-15 DIAGNOSIS — R42 Dizziness and giddiness: Secondary | ICD-10-CM | POA: Diagnosis not present

## 2018-02-15 DIAGNOSIS — M6281 Muscle weakness (generalized): Secondary | ICD-10-CM | POA: Diagnosis not present

## 2018-02-15 DIAGNOSIS — Z79810 Long term (current) use of selective estrogen receptor modulators (SERMs): Secondary | ICD-10-CM | POA: Diagnosis not present

## 2018-02-15 DIAGNOSIS — Z79899 Other long term (current) drug therapy: Secondary | ICD-10-CM | POA: Diagnosis not present

## 2018-02-15 DIAGNOSIS — K219 Gastro-esophageal reflux disease without esophagitis: Secondary | ICD-10-CM | POA: Diagnosis not present

## 2018-02-15 DIAGNOSIS — F339 Major depressive disorder, recurrent, unspecified: Secondary | ICD-10-CM | POA: Diagnosis not present

## 2018-02-15 DIAGNOSIS — R251 Tremor, unspecified: Secondary | ICD-10-CM | POA: Diagnosis not present

## 2018-02-15 DIAGNOSIS — Z87891 Personal history of nicotine dependence: Secondary | ICD-10-CM | POA: Diagnosis not present

## 2018-02-15 DIAGNOSIS — R531 Weakness: Secondary | ICD-10-CM | POA: Diagnosis not present

## 2018-02-15 DIAGNOSIS — Z882 Allergy status to sulfonamides status: Secondary | ICD-10-CM | POA: Diagnosis not present

## 2018-02-15 DIAGNOSIS — Z8673 Personal history of transient ischemic attack (TIA), and cerebral infarction without residual deficits: Secondary | ICD-10-CM | POA: Diagnosis not present

## 2018-02-15 DIAGNOSIS — E785 Hyperlipidemia, unspecified: Secondary | ICD-10-CM | POA: Diagnosis not present

## 2018-02-15 DIAGNOSIS — S33140A Subluxation of L4/L5 lumbar vertebra, initial encounter: Secondary | ICD-10-CM | POA: Diagnosis not present

## 2018-02-15 DIAGNOSIS — F900 Attention-deficit hyperactivity disorder, predominantly inattentive type: Secondary | ICD-10-CM | POA: Diagnosis not present

## 2018-02-15 DIAGNOSIS — Z85828 Personal history of other malignant neoplasm of skin: Secondary | ICD-10-CM | POA: Diagnosis not present

## 2018-02-15 DIAGNOSIS — M545 Low back pain: Secondary | ICD-10-CM | POA: Diagnosis not present

## 2018-02-15 DIAGNOSIS — R001 Bradycardia, unspecified: Secondary | ICD-10-CM | POA: Diagnosis not present

## 2018-02-15 DIAGNOSIS — M8580 Other specified disorders of bone density and structure, unspecified site: Secondary | ICD-10-CM | POA: Diagnosis not present

## 2018-02-15 DIAGNOSIS — Z91040 Latex allergy status: Secondary | ICD-10-CM | POA: Diagnosis not present

## 2018-02-22 DIAGNOSIS — R29898 Other symptoms and signs involving the musculoskeletal system: Secondary | ICD-10-CM | POA: Diagnosis not present

## 2018-02-22 DIAGNOSIS — M899 Disorder of bone, unspecified: Secondary | ICD-10-CM | POA: Diagnosis not present

## 2018-02-22 DIAGNOSIS — I350 Nonrheumatic aortic (valve) stenosis: Secondary | ICD-10-CM | POA: Diagnosis not present

## 2018-02-22 DIAGNOSIS — M5386 Other specified dorsopathies, lumbar region: Secondary | ICD-10-CM | POA: Diagnosis not present

## 2018-02-25 DIAGNOSIS — M5431 Sciatica, right side: Secondary | ICD-10-CM | POA: Diagnosis not present

## 2018-02-25 DIAGNOSIS — M5386 Other specified dorsopathies, lumbar region: Secondary | ICD-10-CM | POA: Diagnosis not present

## 2018-02-25 DIAGNOSIS — M5432 Sciatica, left side: Secondary | ICD-10-CM | POA: Diagnosis not present

## 2018-03-03 DIAGNOSIS — M5431 Sciatica, right side: Secondary | ICD-10-CM | POA: Diagnosis not present

## 2018-03-03 DIAGNOSIS — M5432 Sciatica, left side: Secondary | ICD-10-CM | POA: Diagnosis not present

## 2018-03-10 DIAGNOSIS — M5432 Sciatica, left side: Secondary | ICD-10-CM | POA: Diagnosis not present

## 2018-03-10 DIAGNOSIS — M5431 Sciatica, right side: Secondary | ICD-10-CM | POA: Diagnosis not present

## 2018-03-12 DIAGNOSIS — M899 Disorder of bone, unspecified: Secondary | ICD-10-CM | POA: Diagnosis not present

## 2018-03-12 DIAGNOSIS — R918 Other nonspecific abnormal finding of lung field: Secondary | ICD-10-CM | POA: Diagnosis not present

## 2018-03-12 DIAGNOSIS — R911 Solitary pulmonary nodule: Secondary | ICD-10-CM | POA: Diagnosis not present

## 2018-03-17 DIAGNOSIS — M5432 Sciatica, left side: Secondary | ICD-10-CM | POA: Diagnosis not present

## 2018-03-17 DIAGNOSIS — M5431 Sciatica, right side: Secondary | ICD-10-CM | POA: Diagnosis not present

## 2018-03-24 DIAGNOSIS — M5431 Sciatica, right side: Secondary | ICD-10-CM | POA: Diagnosis not present

## 2018-03-24 DIAGNOSIS — M5432 Sciatica, left side: Secondary | ICD-10-CM | POA: Diagnosis not present

## 2018-03-31 DIAGNOSIS — M5431 Sciatica, right side: Secondary | ICD-10-CM | POA: Diagnosis not present

## 2018-03-31 DIAGNOSIS — R29898 Other symptoms and signs involving the musculoskeletal system: Secondary | ICD-10-CM | POA: Diagnosis not present

## 2018-03-31 DIAGNOSIS — M5432 Sciatica, left side: Secondary | ICD-10-CM | POA: Diagnosis not present

## 2018-04-05 DIAGNOSIS — F329 Major depressive disorder, single episode, unspecified: Secondary | ICD-10-CM | POA: Diagnosis not present

## 2018-04-05 DIAGNOSIS — F909 Attention-deficit hyperactivity disorder, unspecified type: Secondary | ICD-10-CM | POA: Diagnosis not present

## 2018-04-05 DIAGNOSIS — M5432 Sciatica, left side: Secondary | ICD-10-CM | POA: Diagnosis not present

## 2018-04-05 DIAGNOSIS — M5431 Sciatica, right side: Secondary | ICD-10-CM | POA: Diagnosis not present

## 2018-04-07 DIAGNOSIS — R29898 Other symptoms and signs involving the musculoskeletal system: Secondary | ICD-10-CM | POA: Diagnosis not present

## 2018-04-13 DIAGNOSIS — I517 Cardiomegaly: Secondary | ICD-10-CM | POA: Diagnosis not present

## 2018-04-13 DIAGNOSIS — I371 Nonrheumatic pulmonary valve insufficiency: Secondary | ICD-10-CM | POA: Diagnosis not present

## 2018-04-13 DIAGNOSIS — I351 Nonrheumatic aortic (valve) insufficiency: Secondary | ICD-10-CM | POA: Diagnosis not present

## 2018-04-14 DIAGNOSIS — Z6832 Body mass index (BMI) 32.0-32.9, adult: Secondary | ICD-10-CM | POA: Diagnosis not present

## 2018-04-14 DIAGNOSIS — M533 Sacrococcygeal disorders, not elsewhere classified: Secondary | ICD-10-CM | POA: Diagnosis not present

## 2018-04-14 DIAGNOSIS — M545 Low back pain: Secondary | ICD-10-CM | POA: Diagnosis not present

## 2018-04-23 DIAGNOSIS — R948 Abnormal results of function studies of other organs and systems: Secondary | ICD-10-CM | POA: Diagnosis not present

## 2018-04-23 DIAGNOSIS — C189 Malignant neoplasm of colon, unspecified: Secondary | ICD-10-CM | POA: Diagnosis not present

## 2018-06-29 IMAGING — DX DG HAND COMPLETE 3+V*L*
3 series · 3 of 3 positions shown · non-contrast
Comparison: No recent prior.

CLINICAL DATA: Left hand pain.

EXAM:
LEFT HAND - COMPLETE 3+ VIEW

[hand ap]
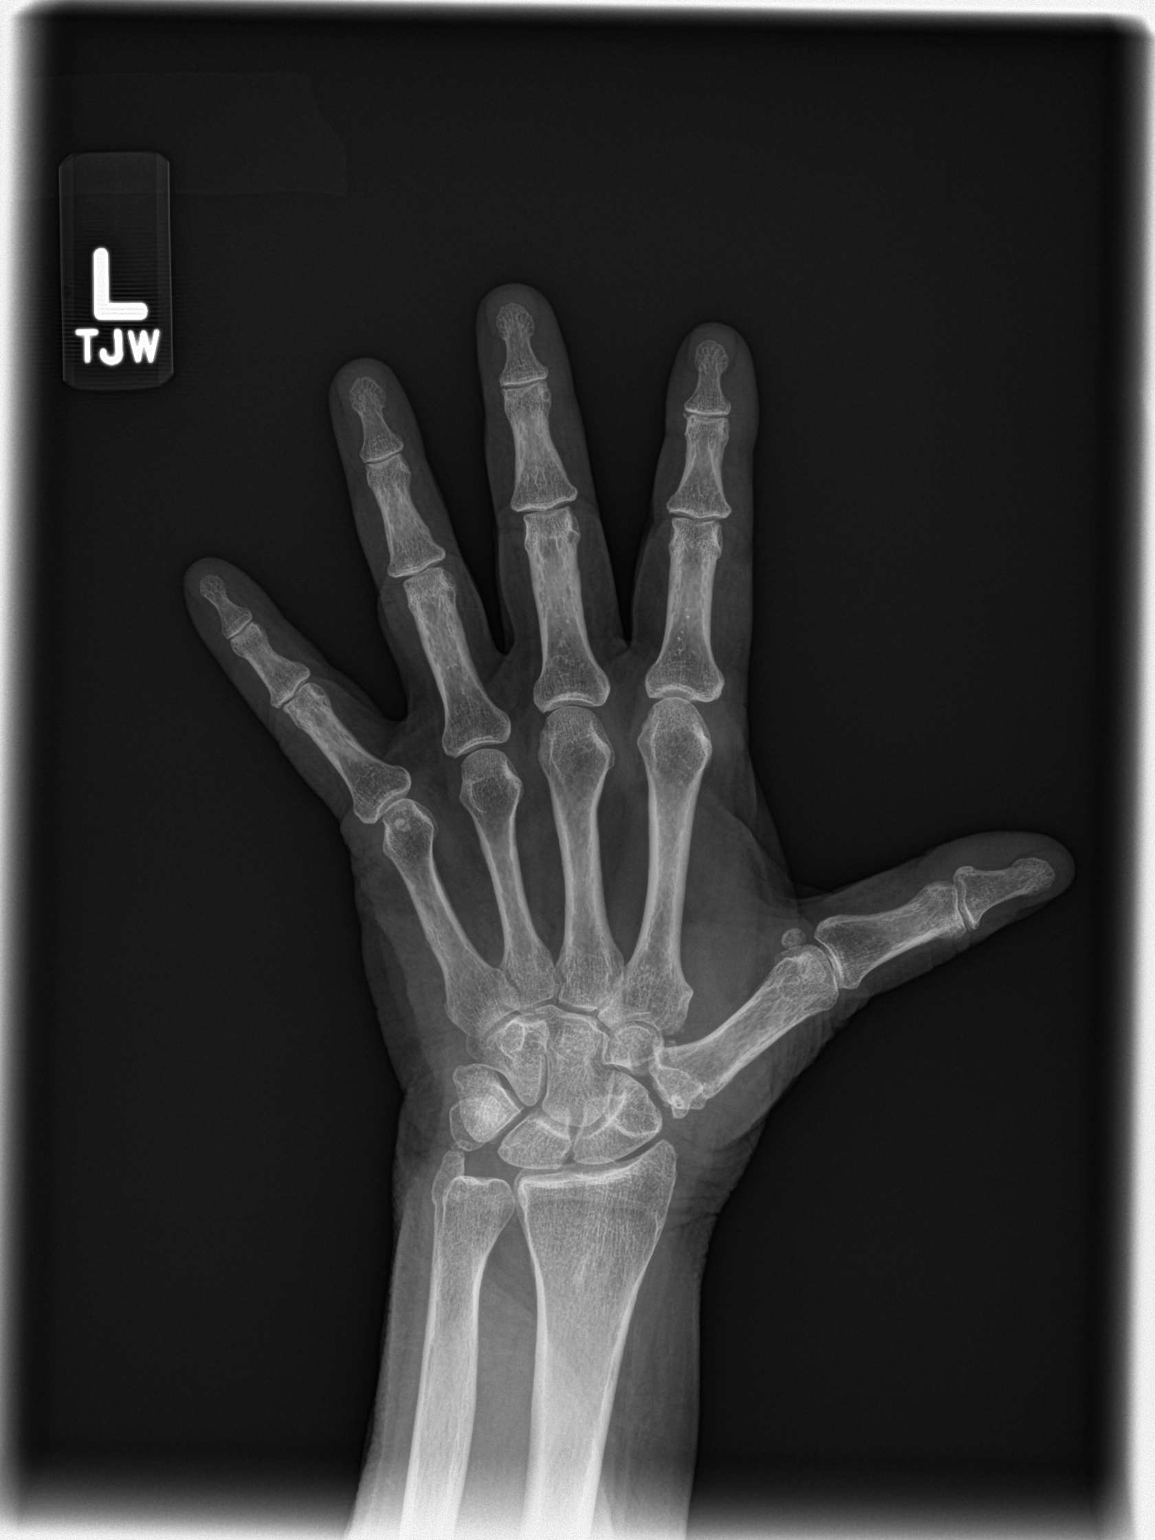

[hand obl]
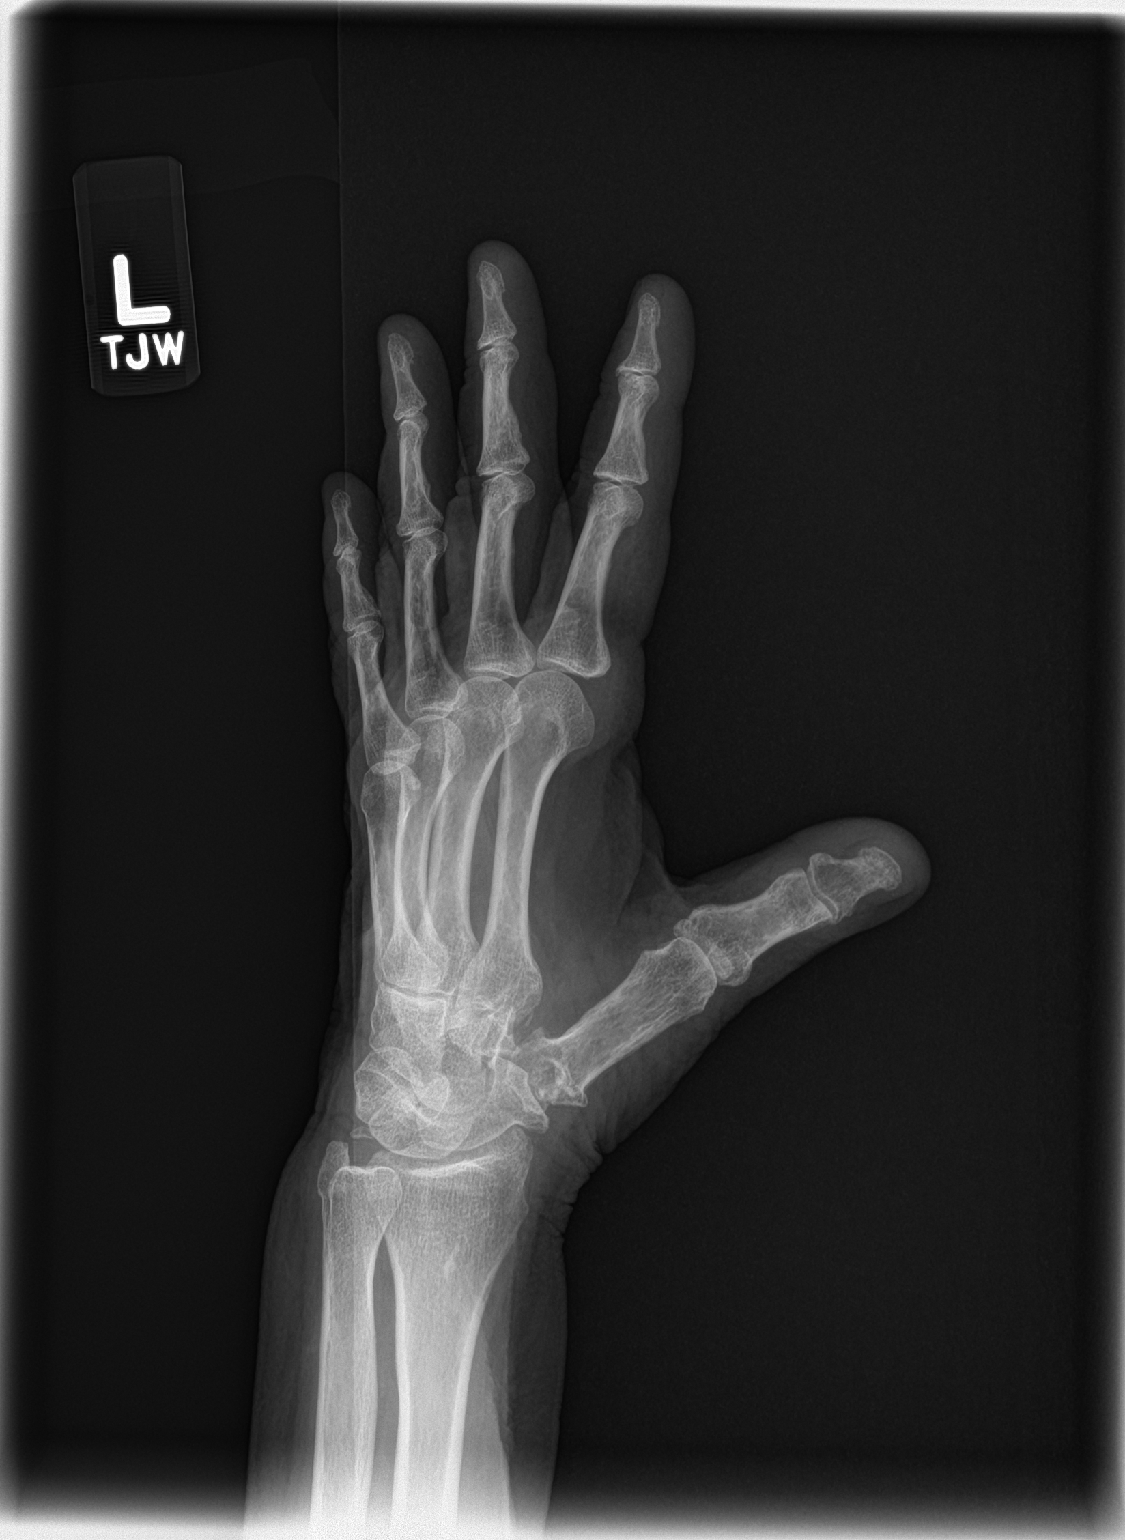

[hand lat]
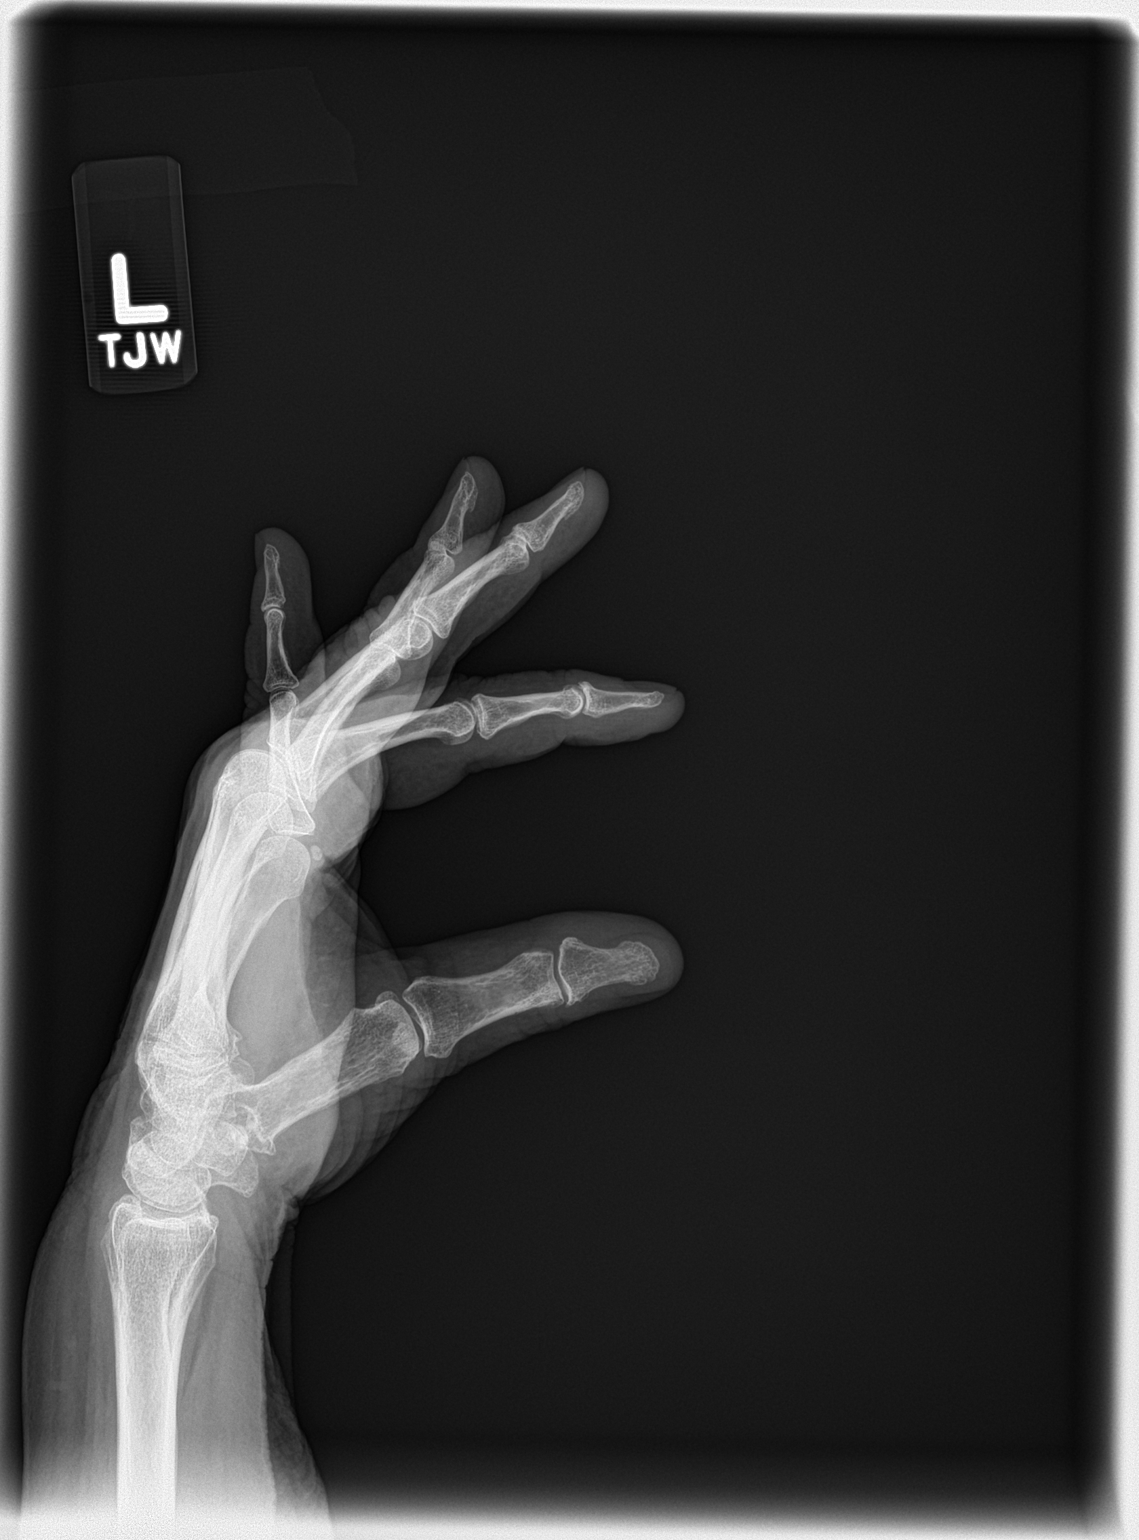

[3 of 3 positions shown; findings below may reference images not displayed]

FINDINGS: No acute bony or joint abnormality identified. No evidence of
fracture or dislocation. Diffuse degenerative change present. Tiny
corticated bony density noted adjacent ulnar styloid, most likely
old fracture fragment.
IMPRESSION: 1. Diffuse degenerative change.

2. Tiny corticated bony density noted adjacent to the ulnar styloid,
most likely old fracture fragment. No acute abnormality.

## 2023-12-05 DEATH — deceased
